# Patient Record
Sex: Female | Born: 1980 | Race: White | Hispanic: No | Marital: Married | Smoking: Current every day smoker
Health system: Southern US, Community
[De-identification: ages and names within clinical notes are randomized; demographics above are authoritative.]

## PROBLEM LIST (undated history)

## (undated) DIAGNOSIS — Z9289 Personal history of other medical treatment: Secondary | ICD-10-CM

## (undated) DIAGNOSIS — F319 Bipolar disorder, unspecified: Secondary | ICD-10-CM

## (undated) DIAGNOSIS — F419 Anxiety disorder, unspecified: Secondary | ICD-10-CM

## (undated) DIAGNOSIS — F1010 Alcohol abuse, uncomplicated: Secondary | ICD-10-CM

## (undated) DIAGNOSIS — M797 Fibromyalgia: Secondary | ICD-10-CM

## (undated) DIAGNOSIS — J42 Unspecified chronic bronchitis: Secondary | ICD-10-CM

## (undated) DIAGNOSIS — J189 Pneumonia, unspecified organism: Secondary | ICD-10-CM

## (undated) DIAGNOSIS — R519 Headache, unspecified: Secondary | ICD-10-CM

## (undated) DIAGNOSIS — F1410 Cocaine abuse, uncomplicated: Secondary | ICD-10-CM

## (undated) DIAGNOSIS — F32A Depression, unspecified: Secondary | ICD-10-CM

## (undated) DIAGNOSIS — M84475G Pathological fracture, left foot, subsequent encounter for fracture with delayed healing: Secondary | ICD-10-CM

## (undated) DIAGNOSIS — F329 Major depressive disorder, single episode, unspecified: Secondary | ICD-10-CM

## (undated) DIAGNOSIS — R51 Headache: Secondary | ICD-10-CM

## (undated) DIAGNOSIS — D649 Anemia, unspecified: Secondary | ICD-10-CM

## (undated) HISTORY — PX: CHEST TUBE INSERTION: SHX231

## (undated) HISTORY — PX: TUBAL LIGATION: SHX77

## (undated) HISTORY — PX: TONSILLECTOMY: SUR1361

---

## 1998-01-13 ENCOUNTER — Other Ambulatory Visit: Admission: RE | Admit: 1998-01-13 | Discharge: 1998-01-13 | Payer: Self-pay | Admitting: Obstetrics and Gynecology

## 2006-04-24 ENCOUNTER — Inpatient Hospital Stay (HOSPITAL_COMMUNITY): Admission: AD | Admit: 2006-04-24 | Discharge: 2006-04-24 | Payer: Self-pay | Admitting: Gynecology

## 2006-05-06 ENCOUNTER — Inpatient Hospital Stay (HOSPITAL_COMMUNITY): Admission: AD | Admit: 2006-05-06 | Discharge: 2006-05-06 | Payer: Self-pay | Admitting: Family Medicine

## 2006-05-29 ENCOUNTER — Inpatient Hospital Stay (HOSPITAL_COMMUNITY): Admission: AD | Admit: 2006-05-29 | Discharge: 2006-05-29 | Payer: Self-pay | Admitting: Gynecology

## 2006-06-19 ENCOUNTER — Inpatient Hospital Stay (HOSPITAL_COMMUNITY): Admission: AD | Admit: 2006-06-19 | Discharge: 2006-06-20 | Payer: Self-pay | Admitting: *Deleted

## 2006-06-20 ENCOUNTER — Encounter: Admission: RE | Admit: 2006-06-20 | Discharge: 2006-06-20 | Payer: Self-pay | Admitting: Orthopedic Surgery

## 2009-08-10 ENCOUNTER — Emergency Department (HOSPITAL_COMMUNITY): Admission: EM | Admit: 2009-08-10 | Discharge: 2009-08-10 | Payer: Self-pay | Admitting: Emergency Medicine

## 2009-08-28 ENCOUNTER — Emergency Department (HOSPITAL_COMMUNITY): Admission: EM | Admit: 2009-08-28 | Discharge: 2009-08-28 | Payer: Self-pay | Admitting: Emergency Medicine

## 2009-10-25 ENCOUNTER — Emergency Department (HOSPITAL_COMMUNITY): Admission: EM | Admit: 2009-10-25 | Discharge: 2009-10-25 | Payer: Self-pay | Admitting: Emergency Medicine

## 2009-11-04 ENCOUNTER — Emergency Department (HOSPITAL_COMMUNITY): Admission: EM | Admit: 2009-11-04 | Discharge: 2009-11-04 | Payer: Self-pay | Admitting: Emergency Medicine

## 2010-01-05 ENCOUNTER — Emergency Department (HOSPITAL_BASED_OUTPATIENT_CLINIC_OR_DEPARTMENT_OTHER): Admission: EM | Admit: 2010-01-05 | Discharge: 2010-01-05 | Payer: Self-pay | Admitting: Emergency Medicine

## 2010-04-23 ENCOUNTER — Emergency Department (HOSPITAL_COMMUNITY): Admission: EM | Admit: 2010-04-23 | Discharge: 2010-04-23 | Payer: Self-pay | Admitting: Emergency Medicine

## 2010-04-25 ENCOUNTER — Ambulatory Visit: Payer: Self-pay | Admitting: Radiology

## 2010-04-25 ENCOUNTER — Emergency Department (HOSPITAL_BASED_OUTPATIENT_CLINIC_OR_DEPARTMENT_OTHER): Admission: EM | Admit: 2010-04-25 | Discharge: 2010-04-25 | Payer: Self-pay | Admitting: Emergency Medicine

## 2010-04-26 ENCOUNTER — Emergency Department (HOSPITAL_COMMUNITY): Admission: EM | Admit: 2010-04-26 | Discharge: 2010-04-28 | Payer: Self-pay | Admitting: Emergency Medicine

## 2010-04-28 ENCOUNTER — Inpatient Hospital Stay (HOSPITAL_COMMUNITY): Admission: AD | Admit: 2010-04-28 | Discharge: 2010-05-04 | Payer: Self-pay | Admitting: Psychiatry

## 2010-04-28 ENCOUNTER — Ambulatory Visit: Payer: Self-pay | Admitting: Psychiatry

## 2011-01-23 ENCOUNTER — Encounter (HOSPITAL_COMMUNITY): Payer: Self-pay | Admitting: Radiology

## 2011-01-23 ENCOUNTER — Observation Stay (HOSPITAL_COMMUNITY)
Admission: AD | Admit: 2011-01-23 | Discharge: 2011-01-25 | Disposition: A | Discharge status: Other Acute Inpatient Hospital | Payer: Medicaid Other | Source: Ambulatory Visit | Attending: Obstetrics & Gynecology | Admitting: Obstetrics & Gynecology

## 2011-01-23 ENCOUNTER — Inpatient Hospital Stay (HOSPITAL_COMMUNITY): Payer: Medicaid Other

## 2011-01-23 DIAGNOSIS — O99340 Other mental disorders complicating pregnancy, unspecified trimester: Principal | ICD-10-CM | POA: Insufficient documentation

## 2011-01-23 DIAGNOSIS — F39 Unspecified mood [affective] disorder: Secondary | ICD-10-CM | POA: Insufficient documentation

## 2011-01-23 LAB — CBC
HCT: 34.9 % — ABNORMAL LOW (ref 36.0–46.0)
MCH: 28.7 pg (ref 26.0–34.0)
MCV: 85.5 fL (ref 78.0–100.0)
RBC: 4.08 MIL/uL (ref 3.87–5.11)
RDW: 11.9 % (ref 11.5–15.5)
WBC: 8.3 10*3/uL (ref 4.0–10.5)

## 2011-01-23 LAB — DIFFERENTIAL
Band Neutrophils: 0 % (ref 0–10)
Basophils Absolute: 0 10*3/uL (ref 0.0–0.1)
Basophils Relative: 0 % (ref 0–1)
Blasts: 0 %
Eosinophils Absolute: 0.3 10*3/uL (ref 0.0–0.7)
Eosinophils Relative: 3 % (ref 0–5)
Lymphs Abs: 3.1 10*3/uL (ref 0.7–4.0)
Metamyelocytes Relative: 0 %
Myelocytes: 0 %
Neutro Abs: 4.2 10*3/uL (ref 1.7–7.7)

## 2011-01-23 LAB — COMPREHENSIVE METABOLIC PANEL
ALT: 21 U/L (ref 0–35)
AST: 19 U/L (ref 0–37)
Albumin: 3.9 g/dL (ref 3.5–5.2)
Alkaline Phosphatase: 71 U/L (ref 39–117)
BUN: 11 mg/dL (ref 6–23)
CO2: 26 mEq/L (ref 19–32)
Chloride: 104 mEq/L (ref 96–112)
Potassium: 3.5 mEq/L (ref 3.5–5.1)

## 2011-01-23 LAB — WET PREP, GENITAL
Trich, Wet Prep: NONE SEEN
Yeast Wet Prep HPF POC: NONE SEEN

## 2011-01-23 LAB — POCT PREGNANCY, URINE: Preg Test, Ur: POSITIVE

## 2011-01-23 LAB — ETHANOL: Alcohol, Ethyl (B): <5 mg/dL (ref 0–10)

## 2011-01-23 LAB — RAPID URINE DRUG SCREEN, HOSP PERFORMED: Opiates: NOT DETECTED

## 2011-01-24 LAB — GC/CHLAMYDIA PROBE AMP, GENITAL: Chlamydia, DNA Probe: NEGATIVE

## 2011-01-25 ENCOUNTER — Inpatient Hospital Stay (HOSPITAL_COMMUNITY)
Admission: AD | Admit: 2011-01-25 | Discharge: 2011-01-30 | DRG: 781 | Disposition: A | Discharge status: Home / Self Care | Payer: PRIVATE HEALTH INSURANCE | Source: Ambulatory Visit | Attending: Psychiatry | Admitting: Psychiatry

## 2011-01-25 DIAGNOSIS — F1211 Cannabis abuse, in remission: Secondary | ICD-10-CM

## 2011-01-25 DIAGNOSIS — F322 Major depressive disorder, single episode, severe without psychotic features: Secondary | ICD-10-CM

## 2011-01-25 DIAGNOSIS — F1411 Cocaine abuse, in remission: Secondary | ICD-10-CM

## 2011-01-25 DIAGNOSIS — Z818 Family history of other mental and behavioral disorders: Secondary | ICD-10-CM

## 2011-01-25 DIAGNOSIS — F1020 Alcohol dependence, uncomplicated: Secondary | ICD-10-CM

## 2011-01-25 DIAGNOSIS — IMO0002 Reserved for concepts with insufficient information to code with codable children: Secondary | ICD-10-CM

## 2011-01-25 DIAGNOSIS — F4310 Post-traumatic stress disorder, unspecified: Secondary | ICD-10-CM

## 2011-01-25 DIAGNOSIS — O99340 Other mental disorders complicating pregnancy, unspecified trimester: Principal | ICD-10-CM

## 2011-01-25 DIAGNOSIS — F339 Major depressive disorder, recurrent, unspecified: Secondary | ICD-10-CM

## 2011-01-25 DIAGNOSIS — D649 Anemia, unspecified: Secondary | ICD-10-CM

## 2011-01-25 DIAGNOSIS — Z8614 Personal history of Methicillin resistant Staphylococcus aureus infection: Secondary | ICD-10-CM

## 2011-01-26 DIAGNOSIS — F339 Major depressive disorder, recurrent, unspecified: Secondary | ICD-10-CM

## 2011-01-26 DIAGNOSIS — F1010 Alcohol abuse, uncomplicated: Secondary | ICD-10-CM
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
## 2011-01-28 LAB — DIFFERENTIAL
Eosinophils Absolute: 0 10*3/uL (ref 0.0–0.7)
Lymphs Abs: 1 10*3/uL (ref 0.7–4.0)
Monocytes Absolute: 0.7 10*3/uL (ref 0.1–1.0)
Monocytes Relative: 7 % (ref 3–12)
Neutro Abs: 8.4 10*3/uL — ABNORMAL HIGH (ref 1.7–7.7)

## 2011-01-28 LAB — CBC
HCT: 38.4 % (ref 36.0–46.0)
MCHC: 34.7 g/dL (ref 30.0–36.0)
MCV: 86.4 fL (ref 78.0–100.0)
RBC: 4.44 MIL/uL (ref 3.87–5.11)
WBC: 10 10*3/uL (ref 4.0–10.5)

## 2011-01-28 LAB — TROPONIN I: Troponin I: 0.01 ng/mL (ref 0.00–0.06)

## 2011-01-28 LAB — BASIC METABOLIC PANEL
BUN: 4 mg/dL — ABNORMAL LOW (ref 6–23)
BUN: 5 mg/dL — ABNORMAL LOW (ref 6–23)
BUN: 5 mg/dL — ABNORMAL LOW (ref 6–23)
Calcium: 9.3 mg/dL (ref 8.4–10.5)
Chloride: 92 mEq/L — ABNORMAL LOW (ref 96–112)
Chloride: 93 mEq/L — ABNORMAL LOW (ref 96–112)
Chloride: 95 mEq/L — ABNORMAL LOW (ref 96–112)
Creatinine, Ser: 0.7 mg/dL (ref 0.4–1.2)
Creatinine, Ser: 0.8 mg/dL (ref 0.4–1.2)
GFR calc Af Amer: >60 mL/min (ref >60)
GFR calc Af Amer: >60 mL/min (ref >60)
GFR calc non Af Amer: >60 mL/min (ref >60)
GFR calc non Af Amer: >60 mL/min (ref >60)
GFR calc non Af Amer: >60 mL/min (ref >60)
Glucose, Bld: 92 mg/dL (ref 70–99)
Potassium: 3.5 mEq/L (ref 3.5–5.1)
Sodium: 129 mEq/L — ABNORMAL LOW (ref 135–145)

## 2011-01-28 LAB — CK TOTAL AND CKMB (NOT AT ARMC)
Relative Index: 2.3 (ref 0.0–2.5)
Total CK: 168 U/L (ref 7–177)

## 2011-01-28 LAB — URINALYSIS, ROUTINE W REFLEX MICROSCOPIC
Hgb urine dipstick: NEGATIVE
Protein, ur: NEGATIVE mg/dL
Specific Gravity, Urine: 1.011 (ref 1.005–1.030)

## 2011-01-28 LAB — PREGNANCY, URINE: Preg Test, Ur: NEGATIVE

## 2011-01-28 LAB — RAPID URINE DRUG SCREEN, HOSP PERFORMED
Barbiturates: NOT DETECTED
Benzodiazepines: POSITIVE — AB
Cocaine: NOT DETECTED
Tetrahydrocannabinol: NOT DETECTED

## 2011-01-28 LAB — SALICYLATE LEVEL: Salicylate Lvl: <4 mg/dL (ref 2.8–20.0)

## 2011-01-28 LAB — ACETAMINOPHEN LEVEL: Acetaminophen (Tylenol), Serum: <10 ug/mL — ABNORMAL LOW (ref 10–30)

## 2011-01-28 LAB — COMPREHENSIVE METABOLIC PANEL
Calcium: 9.7 mg/dL (ref 8.4–10.5)
Chloride: 93 mEq/L — ABNORMAL LOW (ref 96–112)
Glucose, Bld: 105 mg/dL — ABNORMAL HIGH (ref 70–99)
Potassium: 3.9 mEq/L (ref 3.5–5.1)
Sodium: 129 mEq/L — ABNORMAL LOW (ref 135–145)

## 2011-01-28 LAB — RAPID STREP SCREEN (MED CTR MEBANE ONLY): Streptococcus, Group A Screen (Direct): NEGATIVE

## 2011-01-29 LAB — DIFFERENTIAL
Basophils Relative: 1 % (ref 0–1)
Lymphocytes Relative: 19 % (ref 12–46)
Lymphs Abs: 1 10*3/uL (ref 0.7–4.0)
Monocytes Absolute: 0.5 10*3/uL (ref 0.1–1.0)
Monocytes Relative: 9 % (ref 3–12)
Neutro Abs: 3.7 10*3/uL (ref 1.7–7.7)
Neutrophils Relative %: 69 % (ref 43–77)

## 2011-01-29 LAB — BASIC METABOLIC PANEL
Calcium: 9.4 mg/dL (ref 8.4–10.5)
Creatinine, Ser: 1 mg/dL (ref 0.4–1.2)
GFR calc Af Amer: >60 mL/min (ref >60)
GFR calc non Af Amer: >60 mL/min (ref >60)
Sodium: 135 mEq/L (ref 135–145)

## 2011-01-29 LAB — CBC
Hemoglobin: 13.7 g/dL (ref 12.0–15.0)
MCHC: 34 g/dL (ref 30.0–36.0)
RBC: 4.61 MIL/uL (ref 3.87–5.11)
WBC: 5.4 10*3/uL (ref 4.0–10.5)
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
## 2013-01-21 ENCOUNTER — Encounter (HOSPITAL_COMMUNITY): Payer: Self-pay | Admitting: *Deleted

## 2013-01-21 ENCOUNTER — Ambulatory Visit (HOSPITAL_COMMUNITY)
Admission: RE | Admit: 2013-01-21 | Discharge: 2013-01-21 | Disposition: A | Discharge status: Home / Self Care | Payer: Self-pay | Attending: Psychiatry | Admitting: Psychiatry

## 2013-01-21 DIAGNOSIS — F1310 Sedative, hypnotic or anxiolytic abuse, uncomplicated: Secondary | ICD-10-CM | POA: Insufficient documentation

## 2013-01-21 DIAGNOSIS — F411 Generalized anxiety disorder: Secondary | ICD-10-CM | POA: Insufficient documentation

## 2013-01-21 DIAGNOSIS — F1020 Alcohol dependence, uncomplicated: Secondary | ICD-10-CM | POA: Insufficient documentation

## 2013-01-21 DIAGNOSIS — F1410 Cocaine abuse, uncomplicated: Secondary | ICD-10-CM | POA: Insufficient documentation

## 2013-01-21 DIAGNOSIS — F329 Major depressive disorder, single episode, unspecified: Secondary | ICD-10-CM | POA: Insufficient documentation

## 2013-01-21 DIAGNOSIS — F3289 Other specified depressive episodes: Secondary | ICD-10-CM | POA: Insufficient documentation

## 2013-01-21 HISTORY — DX: Pathological fracture, left foot, subsequent encounter for fracture with delayed healing: M84.475G

## 2013-01-21 HISTORY — DX: Major depressive disorder, single episode, unspecified: F32.9

## 2013-01-21 HISTORY — DX: Depression, unspecified: F32.A

## 2013-01-21 HISTORY — DX: Fibromyalgia: M79.7
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
## 2013-01-23 ENCOUNTER — Other Ambulatory Visit (HOSPITAL_COMMUNITY): Payer: Self-pay | Attending: Psychiatry | Admitting: Psychology

## 2013-01-23 DIAGNOSIS — F1410 Cocaine abuse, uncomplicated: Secondary | ICD-10-CM | POA: Insufficient documentation

## 2013-01-23 DIAGNOSIS — F1010 Alcohol abuse, uncomplicated: Secondary | ICD-10-CM | POA: Insufficient documentation

## 2013-01-23 DIAGNOSIS — F1020 Alcohol dependence, uncomplicated: Secondary | ICD-10-CM

## 2013-01-23 DIAGNOSIS — F339 Major depressive disorder, recurrent, unspecified: Secondary | ICD-10-CM | POA: Insufficient documentation

## 2013-01-26 ENCOUNTER — Other Ambulatory Visit (HOSPITAL_COMMUNITY): Payer: Self-pay

## 2013-01-26 ENCOUNTER — Encounter (HOSPITAL_COMMUNITY): Payer: Self-pay | Admitting: Psychology

## 2013-01-26 DIAGNOSIS — F1020 Alcohol dependence, uncomplicated: Secondary | ICD-10-CM | POA: Insufficient documentation

## 2013-01-26 NOTE — Progress Notes (Unsigned)
    Daily Group Progress Note  Program: CD-IOP   Group Time: 1-2:30 PM  Participation Level: Minimal  Behavioral Response: Sharing  Type of Therapy: Process Group  Topic: Group Process: first part of group was spent in process. Members shared about their current struggles in early recovery. There was a new member in group and she introduced herself briefly. There was good disclosure among members. As the session continued, the medical director met with all new group members.   Group Time: 2:45 -4 pm  Participation Level: Minimal  Behavioral Response: Sharing and Resistant  Type of Therapy: Psycho-education Group  Topic: "The Recovery Pie": The second part of group was spent in a presentation on The Recovery Pie, which includes 8 basic elements in recovery. As I drew a pie with 8 segments or pieces on the board, group members identified the different parts. The importance of each one was emphasized and new members were instructed to begin to incorporate these elements into their daily lives. The importance of building a strong routine was reiterated and there was good insight shared by members who have entered these elements into their lives and the benefits of this consistent structure. The elements of this Recovery Pie include: 12-step meetings, working with a sponsor and talking with new friends in recovery, spirituality, exercise, diet, sleep, recreation, and [place].    Summary: The patient participated in group discussion about ways to stay sober. Pt also told the group about her struggle with depression and suicide attempt. She struggles with stress and appears to have few healthy coping skills. She admitted she has 4 small children and is very concerned about them and whether she has caused damage because of her behavior at times. She admitted she does not have many of the 8 pieces of the pie that she does with any regularity. She did well for this first group session and met with the  medical director in the second part of group.    Family Program: Family present? No   Name of family member(s):   UDS collected: No Results:   AA/NA attended?: No, the patient is new to the program  Sponsor?: No   Quency Tober, LCAS

## 2013-01-28 ENCOUNTER — Other Ambulatory Visit (HOSPITAL_COMMUNITY): Payer: Self-pay | Admitting: Psychology

## 2013-01-29 ENCOUNTER — Encounter (HOSPITAL_COMMUNITY): Payer: Self-pay | Admitting: Psychology

## 2013-01-29 ENCOUNTER — Other Ambulatory Visit (HOSPITAL_COMMUNITY): Payer: Self-pay | Admitting: Psychiatry

## 2013-01-29 DIAGNOSIS — F329 Major depressive disorder, single episode, unspecified: Secondary | ICD-10-CM | POA: Insufficient documentation

## 2013-01-29 LAB — PRESCRIPTION ABUSE MONITORING 17P, URINE
Amphetamine/Meth: NEGATIVE ng/mL
Barbiturate Screen, Urine: NEGATIVE ng/mL
Benzodiazepine Screen, Urine: NEGATIVE ng/mL
Carisoprodol, Urine: NEGATIVE ng/mL
Cocaine Metabolites: NEGATIVE ng/mL
Fentanyl, Ur: NEGATIVE ng/mL
MDMA URINE: NEGATIVE ng/mL
Meperidine, Ur: NEGATIVE ng/mL
Oxycodone Screen, Ur: NEGATIVE ng/mL
Propoxyphene: NEGATIVE ng/mL
Tramadol Scrn, Ur: NEGATIVE ng/mL
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
## 2013-01-30 ENCOUNTER — Telehealth (HOSPITAL_COMMUNITY): Payer: Self-pay | Admitting: Psychology

## 2013-01-30 ENCOUNTER — Encounter (HOSPITAL_COMMUNITY): Payer: Self-pay | Admitting: *Deleted

## 2013-01-30 ENCOUNTER — Telehealth (HOSPITAL_COMMUNITY): Payer: Self-pay | Admitting: Behavioral Health

## 2013-01-30 ENCOUNTER — Inpatient Hospital Stay (HOSPITAL_COMMUNITY)
Admission: AD | Admit: 2013-01-30 | Discharge: 2013-02-03 | DRG: 897 | Disposition: A | Discharge status: Home / Self Care | Payer: Federal, State, Local not specified - Other | Source: Other Acute Inpatient Hospital | Attending: Psychiatry | Admitting: Psychiatry

## 2013-01-30 ENCOUNTER — Emergency Department (HOSPITAL_COMMUNITY)
Admission: EM | Admit: 2013-01-30 | Discharge: 2013-01-30 | Disposition: A | Discharge status: Home / Self Care | Payer: Self-pay | Attending: Emergency Medicine | Admitting: Emergency Medicine

## 2013-01-30 ENCOUNTER — Other Ambulatory Visit (HOSPITAL_COMMUNITY): Payer: Self-pay

## 2013-01-30 DIAGNOSIS — F3289 Other specified depressive episodes: Secondary | ICD-10-CM | POA: Insufficient documentation

## 2013-01-30 DIAGNOSIS — Z79899 Other long term (current) drug therapy: Secondary | ICD-10-CM | POA: Insufficient documentation

## 2013-01-30 DIAGNOSIS — F17200 Nicotine dependence, unspecified, uncomplicated: Secondary | ICD-10-CM | POA: Insufficient documentation

## 2013-01-30 DIAGNOSIS — F329 Major depressive disorder, single episode, unspecified: Secondary | ICD-10-CM

## 2013-01-30 DIAGNOSIS — G8929 Other chronic pain: Secondary | ICD-10-CM | POA: Insufficient documentation

## 2013-01-30 DIAGNOSIS — F339 Major depressive disorder, recurrent, unspecified: Secondary | ICD-10-CM | POA: Diagnosis present

## 2013-01-30 DIAGNOSIS — M542 Cervicalgia: Secondary | ICD-10-CM | POA: Insufficient documentation

## 2013-01-30 DIAGNOSIS — Z8739 Personal history of other diseases of the musculoskeletal system and connective tissue: Secondary | ICD-10-CM | POA: Insufficient documentation

## 2013-01-30 DIAGNOSIS — IMO0001 Reserved for inherently not codable concepts without codable children: Secondary | ICD-10-CM | POA: Diagnosis present

## 2013-01-30 DIAGNOSIS — Z8781 Personal history of (healed) traumatic fracture: Secondary | ICD-10-CM | POA: Insufficient documentation

## 2013-01-30 DIAGNOSIS — F1410 Cocaine abuse, uncomplicated: Secondary | ICD-10-CM | POA: Diagnosis present

## 2013-01-30 DIAGNOSIS — F10129 Alcohol abuse with intoxication, unspecified: Secondary | ICD-10-CM | POA: Diagnosis present

## 2013-01-30 DIAGNOSIS — F1010 Alcohol abuse, uncomplicated: Principal | ICD-10-CM | POA: Diagnosis present

## 2013-01-30 DIAGNOSIS — F1020 Alcohol dependence, uncomplicated: Secondary | ICD-10-CM

## 2013-01-30 LAB — URINALYSIS, ROUTINE W REFLEX MICROSCOPIC
Bilirubin Urine: NEGATIVE
Glucose, UA: NEGATIVE mg/dL
Hgb urine dipstick: NEGATIVE
Ketones, ur: NEGATIVE mg/dL
Leukocytes, UA: NEGATIVE
Nitrite: NEGATIVE
Protein, ur: NEGATIVE mg/dL
Specific Gravity, Urine: 1.012 (ref 1.005–1.030)
Urobilinogen, UA: 0.2 mg/dL (ref 0.0–1.0)
pH: 7.5 (ref 5.0–8.0)

## 2013-01-30 LAB — CBC WITH DIFFERENTIAL/PLATELET
Basophils Absolute: 0 K/uL (ref 0.0–0.1)
Basophils Relative: 0 % (ref 0–1)
Eosinophils Absolute: 0.2 K/uL (ref 0.0–0.7)
Eosinophils Relative: 3 % (ref 0–5)
HCT: 37.8 % (ref 36.0–46.0)
Hemoglobin: 12.7 g/dL (ref 12.0–15.0)
Lymphocytes Relative: 45 % (ref 12–46)
Lymphs Abs: 2.7 10*3/uL (ref 0.7–4.0)
MCH: 29.3 pg (ref 26.0–34.0)
MCHC: 33.6 g/dL (ref 30.0–36.0)
MCV: 87.1 fL (ref 78.0–100.0)
Monocytes Absolute: 0.6 K/uL (ref 0.1–1.0)
Monocytes Relative: 11 % (ref 3–12)
Neutro Abs: 2.4 10*3/uL (ref 1.7–7.7)
Neutrophils Relative %: 41 % — ABNORMAL LOW (ref 43–77)
Platelets: 322 K/uL (ref 150–400)
RBC: 4.34 MIL/uL (ref 3.87–5.11)
RDW: 12.1 % (ref 11.5–15.5)
WBC: 5.9 K/uL (ref 4.0–10.5)

## 2013-01-30 LAB — COMPREHENSIVE METABOLIC PANEL WITH GFR
ALT: 11 U/L (ref 0–35)
AST: 14 U/L (ref 0–37)
Calcium: 9.5 mg/dL (ref 8.4–10.5)
Creatinine, Ser: 0.67 mg/dL (ref 0.50–1.10)
Sodium: 138 meq/L (ref 135–145)
Total Protein: 7.3 g/dL (ref 6.0–8.3)

## 2013-01-30 LAB — RAPID URINE DRUG SCREEN, HOSP PERFORMED
Amphetamines: NOT DETECTED
Barbiturates: NOT DETECTED
Benzodiazepines: NOT DETECTED
Cocaine: NOT DETECTED
Opiates: NOT DETECTED
Tetrahydrocannabinol: NOT DETECTED

## 2013-01-30 LAB — COMPREHENSIVE METABOLIC PANEL
Albumin: 3.9 g/dL (ref 3.5–5.2)
Alkaline Phosphatase: 79 U/L (ref 39–117)
BUN: 8 mg/dL (ref 6–23)
CO2: 27 mEq/L (ref 19–32)
Chloride: 101 mEq/L (ref 96–112)
GFR calc Af Amer: >90 mL/min (ref >90)
GFR calc non Af Amer: >90 mL/min (ref >90)
Glucose, Bld: 97 mg/dL (ref 70–99)
Potassium: 3.4 mEq/L — ABNORMAL LOW (ref 3.5–5.1)
Total Bilirubin: 0.3 mg/dL (ref 0.3–1.2)

## 2013-01-30 LAB — ETHANOL: Alcohol, Ethyl (B): <11 mg/dL (ref 0–11)

## 2013-01-30 MED ORDER — CHLORDIAZEPOXIDE HCL 25 MG PO CAPS
25.0000 mg | ORAL_CAPSULE | Freq: Once | ORAL | Status: AC
  Administered 2013-01-31: 25 mg via ORAL
  Filled 2013-01-30: qty 1

## 2013-01-30 MED ORDER — METHOCARBAMOL 500 MG PO TABS
500.0000 mg | ORAL_TABLET | Freq: Three times a day (TID) | ORAL | Status: DC | PRN
  Administered 2013-01-31 – 2013-02-02 (×6): 500 mg via ORAL
  Filled 2013-01-30 (×6): qty 1

## 2013-01-30 MED ORDER — NAPROXEN 500 MG PO TABS
500.0000 mg | ORAL_TABLET | Freq: Two times a day (BID) | ORAL | Status: DC | PRN
  Administered 2013-01-31 – 2013-02-02 (×5): 500 mg via ORAL
  Filled 2013-01-30 (×5): qty 1

## 2013-01-30 MED ORDER — MAGNESIUM HYDROXIDE 400 MG/5ML PO SUSP
30.0000 mL | Freq: Every day | ORAL | Status: DC | PRN
  Administered 2013-02-01 – 2013-02-02 (×2): 30 mL via ORAL

## 2013-01-30 MED ORDER — CHLORDIAZEPOXIDE HCL 25 MG PO CAPS
25.0000 mg | ORAL_CAPSULE | Freq: Four times a day (QID) | ORAL | Status: AC
  Administered 2013-01-31 – 2013-02-01 (×6): 25 mg via ORAL
  Filled 2013-01-30 (×6): qty 1

## 2013-01-30 MED ORDER — LOPERAMIDE HCL 2 MG PO CAPS: 2.0000 mg | ORAL_CAPSULE | ORAL | Status: AC | PRN

## 2013-01-30 MED ORDER — VITAMIN B-1 100 MG PO TABS
100.0000 mg | ORAL_TABLET | Freq: Every day | ORAL | Status: DC
  Administered 2013-01-31 – 2013-02-03 (×4): 100 mg via ORAL
  Filled 2013-01-30 (×5): qty 1

## 2013-01-30 MED ORDER — CHLORDIAZEPOXIDE HCL 25 MG PO CAPS: 25.0000 mg | ORAL_CAPSULE | Freq: Every day | ORAL | Status: DC

## 2013-01-30 MED ORDER — CHLORDIAZEPOXIDE HCL 25 MG PO CAPS
25.0000 mg | ORAL_CAPSULE | ORAL | Status: AC
  Administered 2013-02-02 – 2013-02-03 (×2): 25 mg via ORAL
  Filled 2013-01-30 (×2): qty 1

## 2013-01-30 MED ORDER — DICYCLOMINE HCL 20 MG PO TABS
20.0000 mg | ORAL_TABLET | Freq: Four times a day (QID) | ORAL | Status: DC | PRN
  Administered 2013-01-31: 20 mg via ORAL
  Filled 2013-01-30: qty 1

## 2013-01-30 MED ORDER — HYDROXYZINE HCL 25 MG PO TABS
25.0000 mg | ORAL_TABLET | Freq: Four times a day (QID) | ORAL | Status: AC | PRN
  Administered 2013-01-31 – 2013-02-02 (×4): 25 mg via ORAL

## 2013-01-30 MED ORDER — ADULT MULTIVITAMIN W/MINERALS CH
1.0000 | ORAL_TABLET | Freq: Every day | ORAL | Status: DC
  Administered 2013-01-31 – 2013-02-03 (×4): 1 via ORAL
  Filled 2013-01-30 (×5): qty 1

## 2013-01-30 MED ORDER — ONDANSETRON 4 MG PO TBDP
4.0000 mg | ORAL_TABLET | Freq: Four times a day (QID) | ORAL | Status: DC | PRN
  Administered 2013-01-31: 4 mg via ORAL

## 2013-01-30 MED ORDER — CLONIDINE HCL 0.1 MG PO TABS: 0.1000 mg | ORAL_TABLET | Freq: Every day | ORAL | Status: DC

## 2013-01-30 MED ORDER — ACETAMINOPHEN 325 MG PO TABS: 650.0000 mg | ORAL_TABLET | Freq: Four times a day (QID) | ORAL | Status: DC | PRN

## 2013-01-30 MED ORDER — ALUM & MAG HYDROXIDE-SIMETH 200-200-20 MG/5ML PO SUSP
30.0000 mL | ORAL | Status: DC | PRN
  Administered 2013-02-01: 30 mL via ORAL

## 2013-01-30 MED ORDER — CHLORDIAZEPOXIDE HCL 25 MG PO CAPS
25.0000 mg | ORAL_CAPSULE | Freq: Three times a day (TID) | ORAL | Status: AC
  Administered 2013-02-01 – 2013-02-02 (×3): 25 mg via ORAL
  Filled 2013-01-30 (×2): qty 1

## 2013-01-30 MED ORDER — ONDANSETRON 4 MG PO TBDP: 4.0000 mg | ORAL_TABLET | Freq: Four times a day (QID) | ORAL | Status: AC | PRN

## 2013-01-30 MED ORDER — CLONIDINE HCL 0.1 MG PO TABS
0.1000 mg | ORAL_TABLET | Freq: Four times a day (QID) | ORAL | Status: AC
  Administered 2013-01-31 – 2013-02-02 (×3): 0.1 mg via ORAL
  Filled 2013-01-30 (×10): qty 1

## 2013-01-30 MED ORDER — THIAMINE HCL 100 MG/ML IJ SOLN
100.0000 mg | Freq: Once | INTRAMUSCULAR | Status: DC
Start: 2013-01-31 — End: 2013-02-03

## 2013-01-30 MED ORDER — TRAZODONE HCL 50 MG PO TABS
50.0000 mg | ORAL_TABLET | Freq: Every day | ORAL | Status: DC
  Administered 2013-01-31: 50 mg via ORAL
  Filled 2013-01-30 (×4): qty 1

## 2013-01-30 MED ORDER — LOPERAMIDE HCL 2 MG PO CAPS: 2.0000 mg | ORAL_CAPSULE | ORAL | Status: DC | PRN

## 2013-01-30 MED ORDER — CLONIDINE HCL 0.1 MG PO TABS
0.1000 mg | ORAL_TABLET | ORAL | Status: DC
  Filled 2013-01-30 (×4): qty 1

## 2013-01-30 MED ORDER — CHLORDIAZEPOXIDE HCL 25 MG PO CAPS
25.0000 mg | ORAL_CAPSULE | Freq: Four times a day (QID) | ORAL | Status: AC | PRN
  Administered 2013-02-01 (×2): 25 mg via ORAL
  Filled 2013-01-30 (×3): qty 1

## 2013-01-30 NOTE — ED Provider Notes (Signed)
Medical screening examination/treatment/procedure(s) were performed by non-physician practitioner and as supervising physician I was immediately available for consultation/collaboration.  Calysta Craigo Y. Earvin Blazier, MD 01/30/13 2345 
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
## 2013-01-30 NOTE — ED Notes (Signed)
Pt brought in by Marketing executive from from [place].  IVC-per IV report, per pt's mother, pt has been abusing etoh and have drug addiction.  Pt with hx of this.

## 2013-01-30 NOTE — ED Notes (Signed)
Results sent to [facility]
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
## 2013-01-30 NOTE — ED Provider Notes (Signed)
ECG at time 19:25 shows NSR at rate 84, normal axis, normal intervals.  No ST or T wave abn's.  Wandering baseline.  Borderline ECG.  Likely no sig change compared to ECG from 04/27/10.  Sarah Ellis. Oletta Lamas, MD 01/30/13 2125

## 2013-01-31 ENCOUNTER — Telehealth (HOSPITAL_COMMUNITY): Payer: Self-pay | Admitting: Behavioral Health

## 2013-01-31 DIAGNOSIS — F10129 Alcohol abuse with intoxication, unspecified: Secondary | ICD-10-CM | POA: Diagnosis present

## 2013-01-31 DIAGNOSIS — F339 Major depressive disorder, recurrent, unspecified: Secondary | ICD-10-CM | POA: Diagnosis present

## 2013-01-31 DIAGNOSIS — F1410 Cocaine abuse, uncomplicated: Secondary | ICD-10-CM | POA: Diagnosis present

## 2013-01-31 MED ORDER — DULOXETINE HCL 60 MG PO CPEP
60.0000 mg | ORAL_CAPSULE | Freq: Every day | ORAL | Status: DC
  Administered 2013-01-31 – 2013-02-03 (×4): 60 mg via ORAL
  Filled 2013-01-31 (×5): qty 1

## 2013-01-31 MED ORDER — TRAZODONE HCL 100 MG PO TABS
100.0000 mg | ORAL_TABLET | Freq: Every day | ORAL | Status: DC
  Administered 2013-01-31 – 2013-02-02 (×3): 100 mg via ORAL
  Filled 2013-01-31 (×6): qty 1

## 2013-01-31 MED ORDER — TRAMADOL HCL 50 MG PO TABS
50.0000 mg | ORAL_TABLET | Freq: Once | ORAL | Status: AC
  Administered 2013-01-31: 50 mg via ORAL
  Filled 2013-01-31 (×2): qty 1

## 2013-01-31 NOTE — Tx Team (Signed)
Initial Interdisciplinary Treatment Plan  PATIENT STRENGTHS: (choose at least two) Ability for insight Active sense of humor Average or above average intelligence Capable of independent living Motivation for treatment/growth Supportive family/friends Work skills  PATIENT STRESSORS: Health problems Marital or family conflict Substance abuse   PROBLEM LIST: Problem List/Patient Goals Date to be addressed Date deferred Reason deferred Estimated date of resolution  Substance abuse 01/30/13     Suicidal Ideation (denies now) 01/30/13                                                DISCHARGE CRITERIA:  Improved stabilization in mood, thinking, and/or behavior Medical problems require only outpatient monitoring Motivation to continue treatment in a less acute level of care Need for constant or close observation no longer present Verbal commitment to aftercare and medication compliance Withdrawal symptoms are absent or subacute and managed without 24-hour nursing intervention  PRELIMINARY DISCHARGE PLAN: Attend 12-step recovery group Return to previous living arrangement  PATIENT/FAMIILY INVOLVEMENT: This treatment plan has been presented to and reviewed with the patient, Sarah Ellis.  The patient and family have been given the opportunity to ask questions and make suggestions.  Juliann Pares 01/31/2013, 12:29 AM

## 2013-01-31 NOTE — Clinical Social Work Note (Signed)
[facility] Group Notes: (Clinical Social Work)   01/31/2013      Type of Therapy:  Group Therapy   Participation Level:  Did Not Attend    Ambrose Mantle, LCSW 01/31/2013, 11:18 AM

## 2013-01-31 NOTE — Progress Notes (Signed)
Patient did attend the evening speaker AA meeting.  

## 2013-01-31 NOTE — Progress Notes (Addendum)
Patient ID: Sarah Ellis, female   DOB: 1981-04-30, 32 y.o.   MRN: [id number] D: Pt is asleep in bed this AM. Pt denies SI/HI and A/V hallucinations. Pt did not complete their self-inventory worksheet on request by staff. Pt's most recent CIWA score was 4. Pt mood is depressed and her affect is flat/sad. Pt is sleeping all morning. Pt forwards little to staff and returned to bed after receiving medications.  A: Encouraged pt to discuss feelings with staff and administered medication per MD orders. Writer also encouraged pt to attend groups.  R: Pt is not attending groups but is tolerating medications well. Writer will continue to monitor. 15 minute checks are ongoing for safety.
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
## 2013-01-31 NOTE — Progress Notes (Signed)
Adult Psychoeducational Group Note  Date:  01/31/2013 Time:  0900  Group Topic/Focus:  Patient Self Inventory Review Group  Participation Level:  Did Not Attend  Participation Quality:  Did not attend  Affect:  Did not attend  Cognitive:  Did not attend  Insight: did not attend  Engagement in Group:  Did not attend  Modes of Intervention:  Did not attend  Additional Comments:  Patient refused to come to group this morning.  Earline Mayotte 01/31/2013, 10:55 AM

## 2013-02-01 MED ORDER — NICOTINE 14 MG/24HR TD PT24
14.0000 mg | MEDICATED_PATCH | Freq: Every day | TRANSDERMAL | Status: DC
  Administered 2013-02-01: 14 mg via TRANSDERMAL
  Filled 2013-02-01 (×4): qty 1

## 2013-02-01 MED ORDER — NICOTINE POLACRILEX 2 MG MT GUM
2.0000 mg | CHEWING_GUM | OROMUCOSAL | Status: DC | PRN
  Administered 2013-02-02 (×2): 2 mg via ORAL

## 2013-02-01 MED ORDER — TRAMADOL HCL 50 MG PO TABS
100.0000 mg | ORAL_TABLET | Freq: Two times a day (BID) | ORAL | Status: DC
  Administered 2013-02-01 – 2013-02-03 (×4): 100 mg via ORAL
  Filled 2013-02-01 (×10): qty 2
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
## 2013-02-01 NOTE — Progress Notes (Signed)
Adult Psychoeducational Group Note  Date: 01/31/2013  Time: 11:00AM  Group Topic/Focus:  Healthy Communication: The focus of this group is to discuss communication, barriers to communication, as well as healthy ways to communicate with others.  Participation Level: Active  Participation Quality: Appropriate, Sharing and Supportive  Affect: Appropriate  Cognitive: Appropriate  Insight: Appropriate  Engagement in Group: Engaged and Supportive  Modes of Intervention: Education, Problem-solving and Support  Additional Comments: NONE  [name]  02/01/2013, 1:05 PM    

## 2013-02-01 NOTE — Clinical Social Work Note (Signed)
[facility] Group Notes:  (Clinical Social Work)  02/01/2013  10:00-11:00AM  Summary of Progress/Problems:   The main focus of today's process group was to   identify the patient's current support system and decide on other supports that can be put in place to prevent future hospitalizations.  A handout was used to explain the 4 definitions/levels of support and to think about what support patient has given and received from others.  An emphasis was placed on using counselor, doctor, therapy groups, 12-step groups, and problem-specific support groups to expand supports. The patient expressed understanding of the kinds of support, and an ability to examine her own supports (both given and received).  She was drowsy, and she also was in a group of people who were less open to the group topic than she was.  Type of Therapy:  Process Group with Motivational Interviewing  Participation Level:  Active  Participation Quality:  Attentive and Drowsy  Affect:  Anxious and Blunted  Cognitive:  Appropriate  Insight:  Developing/Improving  Engagement in Therapy:  Developing/Improving  Modes of Intervention:  Clarification, Education, Limit-setting, Problem-solving, Socialization, Support and Processing, Exploration, Discussion, Role-Play   Ambrose Mantle, LCSW 02/01/2013, 10:55 AM

## 2013-02-01 NOTE — Progress Notes (Signed)
D patient slept poorly d/t foot hurting, apetite is improving, energy level is low and abililty to pay attention is good, depressed 8/10 and hopeless 3/10, WD s/s include tremors, chills, cravings and agitation, headaches, dizziness and pain esp in foot, denies SI or HI. Taking meds as ordered by MD and going to DR for meals, attending groups and interacting w/peers. A q30min safety checks continues and support offered, encouraged to continue attending group R patient remains safe on the unit

## 2013-02-01 NOTE — Progress Notes (Signed)
Adult Psychoeducational Group Note  Date:  02/01/2013 Time:  0930  Group Topic/Focus: Self inventory sheets.  Participation Level:  Active  Participation Quality:  Appropriate, Attentive, Sharing and Supportive  Affect:  Anxious and Appropriate  Cognitive:  Alert and Oriented  Insight: Improving  Engagement in Group:  Developing/Improving, Engaged and Improving  Modes of Intervention:  Clarification, Discussion, Orientation and Socialization  Additional Comments:  none  Roselee Culver 02/01/2013, 5:41 PM

## 2013-02-01 NOTE — Progress Notes (Signed)
Patient did attend the evening speaker AA meeting.  

## 2013-02-01 NOTE — Progress Notes (Signed)
Date: 02/01/2013  Time: 15:15pm  Group Topic/Focus:  Making Healthy Choices: The focus of this group is to help patients identify negative/unhealthy choices they were using prior to admission and identify positive/healthier coping strategies to replace them upon discharge.  Participation Level: Active  Participation Quality: Appropriate, Sharing and Supportive  Affect: Appropriate  Cognitive: Appropriate  Insight: Appropriate  Engagement in Group: Engaged and Supportive  Modes of Intervention: Discussion, Education, Problem-solving and Support  Additional Comments: none  Shawnita Krizek M  02/01/2013, 4:07 PM  

## 2013-02-01 NOTE — Progress Notes (Signed)
Adult Psychoeducational Group Note  Date:  02/01/2013 Time:  1315  Group Topic/Focus:  Making Healthy Choices:   The focus of this group is to help patients identify negative/unhealthy choices they were using prior to admission and identify positive/healthier coping strategies to replace them upon discharge.  Participation Level:  Active  Participation Quality:  Appropriate, Attentive, Sharing and Supportive  Affect:  Appropriate and Flat  Cognitive:  Alert, Appropriate and Oriented  Insight: Improving  Engagement in Group:  Developing/Improving, Engaged and Supportive  Modes of Intervention:  Clarification, Discussion, Exploration, Problem-solving, Socialization and Support  Additional Comments:  Discussed self and goals for discharge and support of her family.  Roselee Culver 02/01/2013, 5:44 PM
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
## 2013-02-02 ENCOUNTER — Other Ambulatory Visit (HOSPITAL_COMMUNITY): Payer: Self-pay

## 2013-02-02 DIAGNOSIS — F1994 Other psychoactive substance use, unspecified with psychoactive substance-induced mood disorder: Secondary | ICD-10-CM

## 2013-02-02 DIAGNOSIS — F1010 Alcohol abuse, uncomplicated: Principal | ICD-10-CM

## 2013-02-02 DIAGNOSIS — F411 Generalized anxiety disorder: Secondary | ICD-10-CM

## 2013-02-02 DIAGNOSIS — F329 Major depressive disorder, single episode, unspecified: Secondary | ICD-10-CM

## 2013-02-02 LAB — BUPRENORPHINES (GC/LC/MS), URINE: Norbuprenorphine: 51 ng/mL

## 2013-02-02 MED ORDER — MAGNESIUM CITRATE PO SOLN
1.0000 | Freq: Once | ORAL | Status: AC
  Administered 2013-02-02: 1 via ORAL

## 2013-02-02 NOTE — Progress Notes (Signed)
[facility] LCSW Group Therapy  02/02/2013 1:15 PM  Type of Therapy:  Group Therapy 1:15 to 2:30 PM  Participation Level:  Did Not Attend    Clide Dales
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
## 2013-02-02 NOTE — Progress Notes (Signed)
Pt attended approximately half of the AA group. Pt appeared drowsy.

## 2013-02-02 NOTE — Progress Notes (Signed)
[facility] LCSW Aftercare Discharge Planning Group Note  02/02/2013 8:45 AM  Participation Quality:  Did Not Attend    Clide Dales 02/02/2013, 6:31 PM
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
## 2013-02-03 DIAGNOSIS — F10229 Alcohol dependence with intoxication, unspecified: Secondary | ICD-10-CM

## 2013-02-03 DIAGNOSIS — F339 Major depressive disorder, recurrent, unspecified: Secondary | ICD-10-CM

## 2013-02-03 LAB — RAPID URINE DRUG SCREEN, HOSP PERFORMED: Amphetamines: NOT DETECTED

## 2013-02-03 MED ORDER — DULOXETINE HCL 60 MG PO CPEP: 60.0000 mg | ORAL_CAPSULE | Freq: Every day | ORAL | Status: DC

## 2013-02-03 MED ORDER — TRAZODONE HCL 100 MG PO TABS: 100.0000 mg | ORAL_TABLET | Freq: Every day | ORAL | Status: DC

## 2013-02-03 NOTE — Progress Notes (Signed)
Patient ID: Sarah Ellis, female   DOB: [birth year], 32 y.o.   MRN: [id number]  D:  Pt approached the nurse inquiring about her meds. Stated she was upset because she was given an order for her roxicodone. Pt requested to speak to the PA in reference to her meds. Writer informed pt that Clinical research associate would attempt to verify her prescription. In the meantime the [facility] observed pt nodding in group. Informed the writer that a UDS was needed. Writer and [facility] called [facility] in [place], as requested to verify meds. However, per pharmacist pt hadn't gotten her meds filled at their location, and referred [place] to [facility] [place]. After calling the [facility] determined that pt's xanax and oxycodone were last filled 10/16/12.  Pt stated she didn't have money to get refills. Writer informed the pt that she'd probably need to speak with her pcp about another order.  A:  Support and encouragement was offered. 15 min checks continued for safety.  R: Pt remains safe.
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
## 2013-02-03 NOTE — Progress Notes (Signed)
[facility] LCSW Aftercare Discharge Planning Group Note  02/03/2013 8:45 AM  Participation Quality:  Attentive and Sharing  Affect:  Appropriate  Cognitive:  Appropriate  Insight:  Developing/Improving  Engagement in Group:  Developing/Improving  Modes of Intervention:  Clarification, Exploration, Problem-solving, Rapport Building and Support  Summary of Progress/Problems:  Pt denies both suicidal and homicidal ideation.  On a scale of 1 to 10 with ten being the most ever experienced, the patient rates depression at a 9 (due to pain in foot) and anxiety at an 8 (due to fear of reinjuring foot). Patient continues to report need for pain medication for foot.  Patient not convinced she needs treatment, was at Tabitha's house for 9 months and relapsed after a visit home.  Patient reports desire to be with children in [place], will consider allowing CSW to call husband.    Sarah Ellis
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
## 2013-02-03 NOTE — Progress Notes (Signed)
Pt was discharged home today. She denied any S/I H/I or A/V hallucinations.  She was given f/u appointment, rx, sample medications, and hotline info booklet. She voiced understanding to all instructions provided.  She declined the need for smoking cessation materials.  

## 2013-02-03 NOTE — Progress Notes (Signed)
[facility] INPATIENT:  Family/Significant Other Suicide Prevention Education  Suicide Prevention Education:  Patient Refusal for Family/Significant Other Suicide Prevention Education: The patient Sarah Ellis has refused to provide written consent for family/significant other to be provided Family/Significant Other Suicide Prevention Education during admission and/or prior to discharge. Patient insisted it would make matters worse for CSW to call either her mother or husband.  Writer provided suicide prevention education directly to patient in her room at 10:30 AM today; conversation included risk factors, warning signs and resources to contact for help. Mobile crisis services explained and contact card placed in chart for pt to receive at discharge.  Clide Dales 02/03/2013, 7:12 PM

## 2013-02-03 NOTE — Progress Notes (Signed)
[facility] Adult Case Management Discharge Plan :  Will you be returning to the same living situation after discharge: No. Patient will be staying with a friend At discharge, do you have transportation home?:Yes,  friend to pick her up Do you have the ability to pay for your medications:Yes,  patient reports "not a problem"  Release of information consent forms completed and in the chart;  Patient's signature needed at discharge.  Patient to Follow up at: Follow-up Information   Follow up with [facility] On 02/06/2013. (Go to walkin Clinic on Friday 02/06/13 between 8 AM and 12 noon (the earlier the better) for intake appointment. )    Contact information:   [address] [place] [address]  Ph [phone] Fax [phone]      Patient denies SI/HI:   Yes,  denies both    Safety Planning and Suicide Prevention discussed:  Yes,  with patient as she refused consent to call family  Clide Dales 02/03/2013, 7:14 PM

## 2013-02-04 ENCOUNTER — Other Ambulatory Visit (HOSPITAL_COMMUNITY): Payer: Self-pay

## 2013-02-04 NOTE — Discharge Summary (Signed)
Seen and agreed. Pahola Dimmitt, MD 
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
## 2013-02-06 ENCOUNTER — Other Ambulatory Visit (HOSPITAL_COMMUNITY): Payer: Self-pay

## 2013-02-06 NOTE — Progress Notes (Signed)
Patient Discharge Instructions:  After Visit Summary (AVS):   Faxed to:  02/06/13 Discharge Summary Note:   Faxed to:  02/06/13 Psychiatric Admission Assessment Note:   Faxed to:  02/06/13 Suicide Risk Assessment - Discharge Assessment:   Faxed to:  02/06/13 Faxed/Sent to the Next Level Care provider:  02/06/13 Faxed to [facility] @ [phone]  Jerelene Redden, 02/06/2013, 4:03 PM

## 2013-02-09 ENCOUNTER — Other Ambulatory Visit (HOSPITAL_COMMUNITY): Payer: Self-pay

## 2013-02-11 ENCOUNTER — Other Ambulatory Visit (HOSPITAL_COMMUNITY): Payer: Self-pay

## 2013-02-13 ENCOUNTER — Other Ambulatory Visit (HOSPITAL_COMMUNITY): Payer: Self-pay

## 2013-02-16 ENCOUNTER — Other Ambulatory Visit (HOSPITAL_COMMUNITY): Payer: Self-pay

## 2013-02-18 ENCOUNTER — Other Ambulatory Visit (HOSPITAL_COMMUNITY): Payer: Self-pay

## 2013-02-20 ENCOUNTER — Other Ambulatory Visit (HOSPITAL_COMMUNITY): Payer: Self-pay

## 2013-02-23 ENCOUNTER — Other Ambulatory Visit (HOSPITAL_COMMUNITY): Payer: Self-pay

## 2013-02-25 ENCOUNTER — Other Ambulatory Visit (HOSPITAL_COMMUNITY): Payer: Self-pay

## 2013-02-27 ENCOUNTER — Other Ambulatory Visit (HOSPITAL_COMMUNITY): Payer: Self-pay

## 2013-03-02 ENCOUNTER — Other Ambulatory Visit (HOSPITAL_COMMUNITY): Payer: Self-pay

## 2013-03-04 ENCOUNTER — Other Ambulatory Visit (HOSPITAL_COMMUNITY): Payer: Self-pay

## 2013-03-06 ENCOUNTER — Other Ambulatory Visit (HOSPITAL_COMMUNITY): Payer: Self-pay

## 2013-03-09 ENCOUNTER — Other Ambulatory Visit (HOSPITAL_COMMUNITY): Payer: Self-pay

## 2013-03-11 ENCOUNTER — Other Ambulatory Visit (HOSPITAL_COMMUNITY): Payer: Self-pay

## 2013-03-13 ENCOUNTER — Other Ambulatory Visit (HOSPITAL_COMMUNITY): Payer: Self-pay

## 2013-03-16 ENCOUNTER — Other Ambulatory Visit (HOSPITAL_COMMUNITY): Payer: Self-pay

## 2013-03-18 ENCOUNTER — Other Ambulatory Visit (HOSPITAL_COMMUNITY): Payer: Self-pay

## 2013-03-20 ENCOUNTER — Other Ambulatory Visit (HOSPITAL_COMMUNITY): Payer: Self-pay

## 2013-03-23 ENCOUNTER — Other Ambulatory Visit (HOSPITAL_COMMUNITY): Payer: Self-pay

## 2013-03-25 ENCOUNTER — Other Ambulatory Visit (HOSPITAL_COMMUNITY): Payer: Self-pay

## 2013-03-27 ENCOUNTER — Other Ambulatory Visit (HOSPITAL_COMMUNITY): Payer: Self-pay

## 2013-03-30 ENCOUNTER — Other Ambulatory Visit (HOSPITAL_COMMUNITY): Payer: Self-pay

## 2013-04-01 ENCOUNTER — Other Ambulatory Visit (HOSPITAL_COMMUNITY): Payer: Self-pay

## 2014-09-13 ENCOUNTER — Encounter (HOSPITAL_COMMUNITY): Payer: Self-pay | Admitting: *Deleted

## 2015-12-22 ENCOUNTER — Emergency Department (HOSPITAL_COMMUNITY)
Admission: EM | Admit: 2015-12-22 | Discharge: 2015-12-23 | Disposition: A | Discharge status: Other Acute Inpatient Hospital | Payer: Self-pay | Attending: Emergency Medicine | Admitting: Emergency Medicine

## 2015-12-22 ENCOUNTER — Encounter (HOSPITAL_COMMUNITY): Payer: Self-pay | Admitting: Emergency Medicine

## 2015-12-22 DIAGNOSIS — F1510 Other stimulant abuse, uncomplicated: Secondary | ICD-10-CM | POA: Insufficient documentation

## 2015-12-22 DIAGNOSIS — F17210 Nicotine dependence, cigarettes, uncomplicated: Secondary | ICD-10-CM | POA: Insufficient documentation

## 2015-12-22 DIAGNOSIS — Z3202 Encounter for pregnancy test, result negative: Secondary | ICD-10-CM | POA: Insufficient documentation

## 2015-12-22 DIAGNOSIS — R110 Nausea: Secondary | ICD-10-CM | POA: Insufficient documentation

## 2015-12-22 DIAGNOSIS — F1310 Sedative, hypnotic or anxiolytic abuse, uncomplicated: Secondary | ICD-10-CM | POA: Insufficient documentation

## 2015-12-22 DIAGNOSIS — Z915 Personal history of self-harm: Secondary | ICD-10-CM | POA: Insufficient documentation

## 2015-12-22 DIAGNOSIS — R61 Generalized hyperhidrosis: Secondary | ICD-10-CM | POA: Insufficient documentation

## 2015-12-22 DIAGNOSIS — F1020 Alcohol dependence, uncomplicated: Secondary | ICD-10-CM | POA: Insufficient documentation

## 2015-12-22 DIAGNOSIS — R6883 Chills (without fever): Secondary | ICD-10-CM | POA: Insufficient documentation

## 2015-12-22 DIAGNOSIS — Z8781 Personal history of (healed) traumatic fracture: Secondary | ICD-10-CM | POA: Insufficient documentation

## 2015-12-22 HISTORY — DX: Bipolar disorder, unspecified: F31.9

## 2015-12-22 LAB — COMPREHENSIVE METABOLIC PANEL
ALBUMIN: 4.3 g/dL (ref 3.5–5.0)
ALK PHOS: 133 U/L — AB (ref 38–126)
ALT: 18 U/L (ref 14–54)
ANION GAP: 9 (ref 5–15)
AST: 24 U/L (ref 15–41)
BILIRUBIN TOTAL: 0.8 mg/dL (ref 0.3–1.2)
BUN: 13 mg/dL (ref 6–20)
CO2: 23 mmol/L (ref 22–32)
CREATININE: 0.74 mg/dL (ref 0.44–1.00)
Calcium: 8.9 mg/dL (ref 8.9–10.3)
Chloride: 106 mmol/L (ref 101–111)
GFR calc Af Amer: >60 mL/min (ref >60)
GLUCOSE: 100 mg/dL — AB (ref 65–99)
Potassium: 3.7 mmol/L (ref 3.5–5.1)
Sodium: 138 mmol/L (ref 135–145)
Total Protein: 7.1 g/dL (ref 6.5–8.1)

## 2015-12-22 LAB — CBC
HEMATOCRIT: 39.6 % (ref 36.0–46.0)
HEMOGLOBIN: 13.1 g/dL (ref 12.0–15.0)
MCH: 32.1 pg (ref 26.0–34.0)
MCHC: 33.1 g/dL (ref 30.0–36.0)
MCV: 97.1 fL (ref 78.0–100.0)
Platelets: 294 10*3/uL (ref 150–400)
RBC: 4.08 MIL/uL (ref 3.87–5.11)
RDW: 12 % (ref 11.5–15.5)
WBC: 6.1 10*3/uL (ref 4.0–10.5)

## 2015-12-22 LAB — RAPID URINE DRUG SCREEN, HOSP PERFORMED
AMPHETAMINES: POSITIVE — AB
BARBITURATES: NOT DETECTED
BENZODIAZEPINES: POSITIVE — AB
COCAINE: NOT DETECTED
Opiates: NOT DETECTED
TETRAHYDROCANNABINOL: NOT DETECTED

## 2015-12-22 LAB — PREGNANCY, URINE: PREG TEST UR: NEGATIVE

## 2015-12-22 LAB — ETHANOL

## 2015-12-22 MED ORDER — VITAMIN B-1 100 MG PO TABS
100.0000 mg | ORAL_TABLET | Freq: Every day | ORAL | Status: DC
  Administered 2015-12-22: 100 mg via ORAL
  Filled 2015-12-22: qty 1

## 2015-12-22 MED ORDER — NICOTINE 21 MG/24HR TD PT24
21.0000 mg | MEDICATED_PATCH | Freq: Every day | TRANSDERMAL | Status: DC
  Administered 2015-12-22: 21 mg via TRANSDERMAL
  Filled 2015-12-22: qty 1

## 2015-12-22 MED ORDER — LORAZEPAM 1 MG PO TABS
0.0000 mg | ORAL_TABLET | Freq: Two times a day (BID) | ORAL | Status: DC
  Filled 2015-12-22: qty 2

## 2015-12-22 MED ORDER — LORAZEPAM 1 MG PO TABS
0.0000 mg | ORAL_TABLET | Freq: Four times a day (QID) | ORAL | Status: DC
  Administered 2015-12-22 – 2015-12-23 (×2): 2 mg via ORAL
  Filled 2015-12-22: qty 2

## 2015-12-22 MED ORDER — LORAZEPAM 2 MG/ML IJ SOLN: 0.0000 mg | Freq: Four times a day (QID) | INTRAMUSCULAR | Status: DC

## 2015-12-22 MED ORDER — LORAZEPAM 2 MG/ML IJ SOLN: 0.0000 mg | Freq: Two times a day (BID) | INTRAMUSCULAR | Status: DC

## 2015-12-22 MED ORDER — THIAMINE HCL 100 MG/ML IJ SOLN: 100.0000 mg | Freq: Every day | INTRAMUSCULAR | Status: DC
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
## 2015-12-22 NOTE — ED Notes (Addendum)
Patient denies SI, HI and AVH at this time. Patient states she is here for detox. Plan of care discussed with patient. Patient voices no complaints or concerns at this time. Encouragement and support provided and safety maintain. Q 15 min safety checks in place.

## 2015-12-22 NOTE — ED Notes (Signed)
Telepsych in progress. 
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
## 2015-12-23 ENCOUNTER — Encounter (HOSPITAL_COMMUNITY): Payer: Self-pay | Admitting: Behavioral Health

## 2015-12-23 ENCOUNTER — Inpatient Hospital Stay (HOSPITAL_COMMUNITY)
Admission: EM | Admit: 2015-12-23 | Discharge: 2015-12-31 | DRG: 885 | Disposition: A | Discharge status: Home / Self Care | Payer: Federal, State, Local not specified - Other | Source: Intra-hospital | Attending: Psychiatry | Admitting: Psychiatry

## 2015-12-23 DIAGNOSIS — F411 Generalized anxiety disorder: Secondary | ICD-10-CM | POA: Diagnosis present

## 2015-12-23 DIAGNOSIS — F1410 Cocaine abuse, uncomplicated: Secondary | ICD-10-CM | POA: Diagnosis present

## 2015-12-23 DIAGNOSIS — F1910 Other psychoactive substance abuse, uncomplicated: Secondary | ICD-10-CM

## 2015-12-23 DIAGNOSIS — F1020 Alcohol dependence, uncomplicated: Secondary | ICD-10-CM

## 2015-12-23 DIAGNOSIS — F331 Major depressive disorder, recurrent, moderate: Secondary | ICD-10-CM | POA: Diagnosis present

## 2015-12-23 DIAGNOSIS — F17210 Nicotine dependence, cigarettes, uncomplicated: Secondary | ICD-10-CM | POA: Diagnosis present

## 2015-12-23 DIAGNOSIS — F10239 Alcohol dependence with withdrawal, unspecified: Secondary | ICD-10-CM | POA: Diagnosis present

## 2015-12-23 DIAGNOSIS — F329 Major depressive disorder, single episode, unspecified: Secondary | ICD-10-CM | POA: Diagnosis present

## 2015-12-23 DIAGNOSIS — A599 Trichomoniasis, unspecified: Secondary | ICD-10-CM | POA: Diagnosis present

## 2015-12-23 DIAGNOSIS — F10129 Alcohol abuse with intoxication, unspecified: Secondary | ICD-10-CM | POA: Diagnosis present

## 2015-12-23 MED ORDER — LOPERAMIDE HCL 2 MG PO CAPS: 2.0000 mg | ORAL_CAPSULE | ORAL | Status: DC | PRN

## 2015-12-23 MED ORDER — ADULT MULTIVITAMIN W/MINERALS CH: 1.0000 | ORAL_TABLET | Freq: Every day | ORAL | Status: DC

## 2015-12-23 MED ORDER — LORAZEPAM 1 MG PO TABS
1.0000 mg | ORAL_TABLET | Freq: Three times a day (TID) | ORAL | Status: DC
  Administered 2015-12-24 (×2): 1 mg via ORAL
  Filled 2015-12-23 (×2): qty 1

## 2015-12-23 MED ORDER — LORAZEPAM 1 MG PO TABS: 1.0000 mg | ORAL_TABLET | Freq: Every day | ORAL | Status: DC

## 2015-12-23 MED ORDER — VITAMIN B-1 100 MG PO TABS
100.0000 mg | ORAL_TABLET | Freq: Every day | ORAL | Status: DC
  Administered 2015-12-24 – 2015-12-31 (×8): 100 mg via ORAL
  Filled 2015-12-23 (×10): qty 1

## 2015-12-23 MED ORDER — LORAZEPAM 1 MG PO TABS: 1.0000 mg | ORAL_TABLET | Freq: Two times a day (BID) | ORAL | Status: DC

## 2015-12-23 MED ORDER — ADULT MULTIVITAMIN W/MINERALS CH
1.0000 | ORAL_TABLET | Freq: Every day | ORAL | Status: DC
  Administered 2015-12-23 – 2015-12-31 (×9): 1 via ORAL
  Filled 2015-12-23 (×5): qty 1
  Filled 2015-12-23: qty 21
  Filled 2015-12-23 (×5): qty 1
  Filled 2015-12-23: qty 21

## 2015-12-23 MED ORDER — HYDROXYZINE HCL 25 MG PO TABS
25.0000 mg | ORAL_TABLET | Freq: Four times a day (QID) | ORAL | Status: DC | PRN
  Filled 2015-12-23: qty 1

## 2015-12-23 MED ORDER — ACETAMINOPHEN 325 MG PO TABS
650.0000 mg | ORAL_TABLET | Freq: Four times a day (QID) | ORAL | Status: DC | PRN
  Administered 2015-12-23 – 2015-12-29 (×2): 650 mg via ORAL
  Filled 2015-12-23 (×2): qty 2

## 2015-12-23 MED ORDER — MAGNESIUM HYDROXIDE 400 MG/5ML PO SUSP
30.0000 mL | Freq: Every day | ORAL | Status: DC | PRN
  Administered 2015-12-27 (×2): 30 mL via ORAL
  Filled 2015-12-23 (×2): qty 30

## 2015-12-23 MED ORDER — LORAZEPAM 1 MG PO TABS
1.0000 mg | ORAL_TABLET | Freq: Four times a day (QID) | ORAL | Status: DC | PRN
  Administered 2015-12-23: 1 mg via ORAL
  Filled 2015-12-23: qty 1

## 2015-12-23 MED ORDER — LORAZEPAM 1 MG PO TABS
1.0000 mg | ORAL_TABLET | Freq: Four times a day (QID) | ORAL | Status: AC
  Administered 2015-12-23: 1 mg via ORAL
  Filled 2015-12-23 (×2): qty 1

## 2015-12-23 MED ORDER — VITAMIN B-1 100 MG PO TABS: 100.0000 mg | ORAL_TABLET | Freq: Every day | ORAL | Status: DC

## 2015-12-23 MED ORDER — TRAZODONE HCL 100 MG PO TABS
100.0000 mg | ORAL_TABLET | Freq: Every day | ORAL | Status: DC
  Filled 2015-12-23: qty 1

## 2015-12-23 MED ORDER — ONDANSETRON 4 MG PO TBDP: 4.0000 mg | ORAL_TABLET | Freq: Four times a day (QID) | ORAL | Status: DC | PRN

## 2015-12-23 MED ORDER — HYDROXYZINE HCL 25 MG PO TABS: 25.0000 mg | ORAL_TABLET | Freq: Four times a day (QID) | ORAL | Status: DC | PRN

## 2015-12-23 MED ORDER — ONDANSETRON 4 MG PO TBDP
4.0000 mg | ORAL_TABLET | Freq: Four times a day (QID) | ORAL | Status: DC | PRN
Start: 2015-12-23 — End: 2015-12-24

## 2015-12-23 MED ORDER — DULOXETINE HCL 60 MG PO CPEP
60.0000 mg | ORAL_CAPSULE | Freq: Every day | ORAL | Status: DC
  Administered 2015-12-23 – 2015-12-24 (×2): 60 mg via ORAL
  Filled 2015-12-23 (×4): qty 1

## 2015-12-23 MED ORDER — QUETIAPINE FUMARATE 50 MG PO TABS
50.0000 mg | ORAL_TABLET | Freq: Every day | ORAL | Status: DC
  Administered 2015-12-23 – 2015-12-24 (×2): 50 mg via ORAL
  Filled 2015-12-23 (×4): qty 1

## 2015-12-23 MED ORDER — THIAMINE HCL 100 MG/ML IJ SOLN: 100.0000 mg | Freq: Once | INTRAMUSCULAR | Status: AC

## 2015-12-23 MED ORDER — LORAZEPAM 1 MG PO TABS
ORAL_TABLET | ORAL | Status: AC
  Filled 2015-12-23: qty 1

## 2015-12-23 MED ORDER — LORAZEPAM 1 MG PO TABS
1.0000 mg | ORAL_TABLET | Freq: Four times a day (QID) | ORAL | Status: DC | PRN
Start: 2015-12-23 — End: 2015-12-23
  Administered 2015-12-23: 1 mg via ORAL
  Filled 2015-12-23: qty 1

## 2015-12-23 MED ORDER — ALUM & MAG HYDROXIDE-SIMETH 200-200-20 MG/5ML PO SUSP: 30.0000 mL | ORAL | Status: DC | PRN

## 2015-12-23 NOTE — ED Notes (Signed)
Writer asked patient if she had a seizure disorder or if she ever had a seizure before. Patient states "No I do not have seizures but i take seizure medication as a mood stabilizer.

## 2015-12-23 NOTE — Progress Notes (Signed)
Pt did not attend AA group this evening.  

## 2015-12-23 NOTE — Tx Team (Signed)
Initial Interdisciplinary Treatment Plan   PATIENT STRESSORS: Marital or family conflict Medication change or noncompliance Substance abuse   PATIENT STRENGTHS: Ability for insight Average or above average intelligence Capable of independent living   PROBLEM LIST: Problem List/Patient Goals Date to be addressed Date deferred Reason deferred Estimated date of resolution  "i need to stop drinking" 12/23/15     "I need to get away from my boyfriend" 12/23/15     "I should be back on my meds" 12/23/15                                          DISCHARGE CRITERIA:  Ability to meet basic life and health needs Adequate post-discharge living arrangements Improved stabilization in mood, thinking, and/or behavior Withdrawal symptoms are absent or subacute and managed without 24-hour nursing intervention  PRELIMINARY DISCHARGE PLAN: Attend aftercare/continuing care group Outpatient therapy  PATIENT/FAMIILY INVOLVEMENT: This treatment plan has been presented to and reviewed with the patient, [name].  The patient and family have been given the opportunity to ask questions and make suggestions.  [name] 12/23/2015, 5:15 PM
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
## 2015-12-23 NOTE — Progress Notes (Signed)
Sarah Ellis denies SI/HI/AVH at this time. She is complaining of restless legs with trazodone and vistaril.  She is requesting a sleep aid tonight. New orders given. Encouraged [name] to work on goals in order to evaluate her progression or lack thereof. Medications administered as prescribed. Continue to monitor Q 15 minutes for patient safety and medication effectiveness.

## 2015-12-23 NOTE — Progress Notes (Signed)
[facility] INPATIENT:  Family/Significant Other Suicide Prevention Education  Suicide Prevention Education:  Patient Refusal for Family/Significant Other Suicide Prevention Education: The patient Sarah Ellis has refused to provide written consent for family/significant other to be provided Family/Significant Other Suicide Prevention Education during admission and/or prior to discharge.    Angeline Slim M 12/23/2015, 5:53 AM

## 2015-12-23 NOTE — ED Notes (Signed)
Pt transported to [facility], OBS unit  by [facility] transportation service for continuation of specialized care. Patient had no belongings and signed stating the same. Pt left in no acute distress.
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
## 2015-12-23 NOTE — Progress Notes (Signed)
[facility] Observation Crisis Plan  Reason for Crisis Plan:  Medication Management and Substance Abuse   Plan of Care:    Family Support:    Mother  Current Living Environment:   Homeless  Insurance:   Hospital Account    Name Acct ID Class Status Primary Coverage   Sarah Ellis, Sarah Ellis [id number] BEHAVIORAL HEALTH OBSERVATION Open None        Guarantor Account (for Hospital Account [id number])    Name Relation to Pt Service Area Active? Acct Type   Sarah Ellis Self [facility] Yes Behavioral Health   Address Phone       [address] [place], [place] [address] [phone](H)          Coverage Information (for Hospital Account [id number])    Not on file      Legal Guardian:     Primary Care Provider:  No primary care provider on file.  Current Outpatient Providers:  none  Psychiatrist:     Counselor/Therapist:     Compliant with Medications:  No  Additional Information:   Sarah Ellis 2/10/20175:53 AM

## 2015-12-24 ENCOUNTER — Encounter (HOSPITAL_COMMUNITY): Payer: Self-pay | Admitting: Psychiatry

## 2015-12-24 DIAGNOSIS — F1410 Cocaine abuse, uncomplicated: Secondary | ICD-10-CM

## 2015-12-24 DIAGNOSIS — F331 Major depressive disorder, recurrent, moderate: Principal | ICD-10-CM

## 2015-12-24 DIAGNOSIS — F1020 Alcohol dependence, uncomplicated: Secondary | ICD-10-CM

## 2015-12-24 MED ORDER — CHLORDIAZEPOXIDE HCL 25 MG PO CAPS
25.0000 mg | ORAL_CAPSULE | Freq: Three times a day (TID) | ORAL | Status: AC
  Administered 2015-12-25 – 2015-12-26 (×3): 25 mg via ORAL
  Filled 2015-12-24 (×3): qty 1

## 2015-12-24 MED ORDER — CHLORDIAZEPOXIDE HCL 25 MG PO CAPS
25.0000 mg | ORAL_CAPSULE | Freq: Every day | ORAL | Status: AC
  Administered 2015-12-28: 25 mg via ORAL
  Filled 2015-12-24: qty 1

## 2015-12-24 MED ORDER — LOPERAMIDE HCL 2 MG PO CAPS: 2.0000 mg | ORAL_CAPSULE | ORAL | Status: AC | PRN

## 2015-12-24 MED ORDER — CHLORDIAZEPOXIDE HCL 25 MG PO CAPS
25.0000 mg | ORAL_CAPSULE | Freq: Four times a day (QID) | ORAL | Status: AC
  Administered 2015-12-24 – 2015-12-25 (×4): 25 mg via ORAL
  Filled 2015-12-24 (×5): qty 1

## 2015-12-24 MED ORDER — THIAMINE HCL 100 MG/ML IJ SOLN
100.0000 mg | Freq: Once | INTRAMUSCULAR | Status: AC
  Administered 2015-12-24: 100 mg via INTRAMUSCULAR
  Filled 2015-12-24: qty 2

## 2015-12-24 MED ORDER — CHLORDIAZEPOXIDE HCL 25 MG PO CAPS
25.0000 mg | ORAL_CAPSULE | ORAL | Status: AC
  Administered 2015-12-26 – 2015-12-27 (×2): 25 mg via ORAL
  Filled 2015-12-24 (×2): qty 1

## 2015-12-24 MED ORDER — GABAPENTIN 300 MG PO CAPS
300.0000 mg | ORAL_CAPSULE | Freq: Three times a day (TID) | ORAL | Status: DC
  Administered 2015-12-24 – 2015-12-26 (×6): 300 mg via ORAL
  Filled 2015-12-24 (×11): qty 1

## 2015-12-24 MED ORDER — CHLORDIAZEPOXIDE HCL 25 MG PO CAPS
25.0000 mg | ORAL_CAPSULE | Freq: Four times a day (QID) | ORAL | Status: AC | PRN
  Administered 2015-12-25 – 2015-12-27 (×4): 25 mg via ORAL
  Filled 2015-12-24 (×4): qty 1

## 2015-12-24 MED ORDER — CHLORDIAZEPOXIDE HCL 25 MG PO CAPS
25.0000 mg | ORAL_CAPSULE | Freq: Once | ORAL | Status: AC
  Administered 2015-12-24: 25 mg via ORAL
  Filled 2015-12-24: qty 1

## 2015-12-24 MED ORDER — NICOTINE 21 MG/24HR TD PT24
21.0000 mg | MEDICATED_PATCH | Freq: Every day | TRANSDERMAL | Status: DC
  Administered 2015-12-24 – 2015-12-31 (×8): 21 mg via TRANSDERMAL
  Filled 2015-12-24 (×11): qty 1

## 2015-12-24 MED ORDER — DULOXETINE HCL 20 MG PO CPEP
20.0000 mg | ORAL_CAPSULE | Freq: Every day | ORAL | Status: DC
  Administered 2015-12-25 – 2015-12-27 (×3): 20 mg via ORAL
  Filled 2015-12-24 (×5): qty 1

## 2015-12-24 NOTE — [facility] Suicide Risk Assessment (Addendum)
[facility] INPATIENT:  Family/Significant   Other Suicide Prevention Education  Pt did not express suicidal ideation at admission.  She declined to give permission for collateral contact with anyone in her life.  Suicide Prevention Education:  Patient Refusal for Family/Significant Other Suicide Prevention Education: The patient Sarah Ellis has refused to provide written consent for family/significant other to be provided Family/Significant Other Suicide Prevention Education during admission and/or prior to discharge.  Physician notified.  Sarina Ser 12/24/2015, 4:14 PM
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
## 2015-12-24 NOTE — [facility] Group Notes (Signed)
[facility] Group Notes: (Clinical Social Work)   12/24/2015      Type of Therapy:  Group Therapy   Participation Level:  Did Not Attend despite MHT prompting   Ambrose Mantle, LCSW 12/24/2015, 12:19 PM
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
## 2015-12-24 NOTE — Progress Notes (Signed)
Patient attended wrap up group. Rated her day a 1 due to the withdrawals she is having while trying to sleep. Goal is to try to get clean.

## 2015-12-24 NOTE — [facility] Counselor (Signed)
[facility] application given to pt to complete.  Ambrose Mantle, LCSW 12/24/2015, 4:20 PM

## 2015-12-24 NOTE — [facility] Suicide Risk Assessment (Signed)
[facility] Admission Suicide Risk Assessment   Nursing information obtained from:    Demographic factors:    Current Mental Status:    Loss Factors:    Historical Factors:    Risk Reduction Factors:     Total Time spent with patient: 30 minutes Principal Problem: Alcohol use disorder, severe, dependence (HCC) Diagnosis:   Patient Active Problem List   Diagnosis Date Noted  . Major depressive disorder, recurrent episode, moderate with anxious distress (HCC) [F33.1] 12/23/2015  . Alcohol use disorder, severe, dependence (HCC) [F10.20] 12/23/2015  . Cocaine abuse [F14.10] 01/31/2013    Class: Acute   Subjective Data: Pt states " I am withdrawing , I have a lot of tremors. I was also feeling depressed.'   Continued Clinical Symptoms:  Alcohol Use Disorder Identification Test Final Score (AUDIT): 36 The "Alcohol Use Disorders Identification Test", Guidelines for Use in Primary Care, Second Edition.  World Science writer [facility]). Score between 0-7:  no or low risk or alcohol related problems. Score between 8-15:  moderate risk of alcohol related problems. Score between 16-19:  high risk of alcohol related problems. Score 20 or above:  warrants further diagnostic evaluation for alcohol dependence and treatment.   CLINICAL FACTORS:   Depression:   Comorbid alcohol abuse/dependence Alcohol/Substance Abuse/Dependencies Unstable or Poor Therapeutic Relationship Previous Psychiatric Diagnoses and Treatments   Musculoskeletal: Strength & Muscle Tone: within normal limits Gait & Station: normal Patient leans: N/A  Psychiatric Specialty Exam: Review of Systems  Psychiatric/Behavioral: Positive for depression and substance abuse. The patient is nervous/anxious.   All other systems reviewed and are negative.   Blood pressure 108/62, pulse 75, temperature 98 F (36.7 C), temperature source Oral, resp. rate 16, height  (1.651 m), weight 60.328 kg (133 lb), last menstrual period  12/11/2015, SpO2 100 %.Body mass index is 22.13 kg/(m^2).  General Appearance: Disheveled  Eye Solicitor::  Fair  Speech:  Clear and Coherent  Volume:  Decreased  Mood:  Anxious and Depressed  Affect:  Congruent  Thought Process:  Coherent  Orientation:  Other:  person,place  Thought Content:  Rumination  Suicidal Thoughts:  No  Homicidal Thoughts:  No  Memory:  Immediate;   Fair Recent;   Fair Remote;   Fair  Judgement:  Impaired  Insight:  Shallow  Psychomotor Activity:  Increased and Restlessness  Concentration:  Poor  Recall:  [employer] of Knowledge:Fair  Language: Fair  Akathisia:  No    AIMS (if indicated):     Assets:  Desire for Improvement  Sleep:  Number of Hours: 6.75  Cognition: WNL  ADL's:  Intact    COGNITIVE FEATURES THAT CONTRIBUTE TO RISK:  Closed-mindedness, Polarized thinking and Thought constriction (tunnel vision)    SUICIDE RISK:   Moderate:  Frequent suicidal ideation with limited intensity, and duration, some specificity in terms of plans, no associated intent, good self-control, limited dysphoria/symptomatology, some risk factors present, and identifiable protective factors, including available and accessible social support.  PLAN OF CARE: Patient will benefit from inpatient treatment and stabilization.  Estimated length of stay is 5-7 days.  Reviewed past medical records,treatment plan.  Will continue CIWA protocol. Will continue Cymbalta 60 mg po daily for affective sx. Will continue Seroquel 50 mg po qhs for mood sx/sleep. Case discussed with NP Shuvon - please also see H&P. Will continue to monitor vitals ,medication compliance and treatment side effects while patient is here.  Will monitor for medical issues as well as call consult as needed.  CSW will start working on disposition.  Patient to participate in therapeutic milieu .       I certify that inpatient services furnished can reasonably be expected to improve the patient's  condition.   Jonnae Fonseca, MD 12/24/2015, 12:15 PM

## 2015-12-24 NOTE — Progress Notes (Signed)
Sarah Ellis is endorsing restless legs, tremors, sweating, chills and cramps. Rates Anxiety 9/10 and Depression 10/10.  Denies SI/HI/AVH.  Encouragement and support given. Medications administered as prescribed. Continue to monitor Q 15 minutes for patient safety and medication effectiveness.

## 2015-12-24 NOTE — Progress Notes (Signed)
D: Patient denies SI/HI/AVH.   Patient affect and mood are anxious.  Patient has visible tremors in her BUE.  Patient visible on the milieu. No distress noted. A: Support and encouragement offered. Scheduled medications given to pt. Q 15 min checks continued for patient safety. R: Patient receptive. Patient remains safe on the unit.

## 2015-12-25 LAB — LIPID PANEL
Cholesterol: 208 mg/dL — ABNORMAL HIGH (ref 0–200)
HDL: 93 mg/dL (ref >40)
LDL Cholesterol: 95 mg/dL (ref 0–99)
Total CHOL/HDL Ratio: 2.2 RATIO
Triglycerides: 102 mg/dL (ref <150)
VLDL: 20 mg/dL (ref 0–40)

## 2015-12-25 LAB — HIV ANTIBODY (ROUTINE TESTING W REFLEX): HIV SCREEN 4TH GENERATION: NONREACTIVE

## 2015-12-25 MED ORDER — MIRTAZAPINE 7.5 MG PO TABS
7.5000 mg | ORAL_TABLET | Freq: Every day | ORAL | Status: DC
  Administered 2015-12-25 – 2015-12-26 (×2): 7.5 mg via ORAL
  Filled 2015-12-25 (×4): qty 1

## 2015-12-25 MED ORDER — AZITHROMYCIN 500 MG PO TABS
1000.0000 mg | ORAL_TABLET | Freq: Once | ORAL | Status: AC
  Administered 2015-12-25: 1000 mg via ORAL
  Filled 2015-12-25: qty 4
  Filled 2015-12-25: qty 2

## 2015-12-25 NOTE — Progress Notes (Signed)
Pt attended AA group this evening.  
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
## 2015-12-25 NOTE — [facility] Group Notes (Signed)
[facility] Group Notes:  (Nursing/MHT/Case Management/Adjunct)  Date:  12/25/2015  Time:  1:15 pm  Type of Therapy:  Nurse Education  Participation Level:  Active  Participation Quality:  Appropriate, Attentive, Sharing and Supportive  Affect:  Appropriate  Cognitive:  Alert and Oriented  Insight:  Appropriate  Engagement in Group:  Engaged and Supportive  Modes of Intervention:  Discussion and Education  Summary of Progress/Problems:  Group topic was [employer].  Discussed importance of setting daily goals.  She was attentive and supportive of others during the group.    Norm Parcel Yuan Gann 12/25/2015, 2:13 PM

## 2015-12-25 NOTE — Progress Notes (Signed)
DAR NOTE: Pt present with flat affect and depressed mood in the unit. Pt has been observed interacting with peer in the dayroom. Pt denies physical pain, took all her meds as scheduled. As per self inventory, pt had a poor night sleep, poor appetite, low energy, and poor concentration. Pt rate depression at 10, hopeless ness at 4, and anxiety at 10. Pt's safety ensured with 15 minute and environmental checks. Pt currently denies SI/HI and A/V hallucinations. Pt verbally agrees to seek staff if SI/HI or A/VH occurs and to consult with staff before acting on these thoughts. Will continue POC.

## 2015-12-25 NOTE — [facility] Group Notes (Signed)
[facility] Group Notes: (Clinical Social Work)   12/25/2015      Type of Therapy:  Group Therapy   Participation Level:  Did Not Attend despite MHT prompting   Ambrose Mantle, LCSW 12/25/2015, 1:18 PM

## 2015-12-26 ENCOUNTER — Encounter (HOSPITAL_COMMUNITY): Payer: Self-pay | Admitting: Psychiatry

## 2015-12-26 DIAGNOSIS — F411 Generalized anxiety disorder: Secondary | ICD-10-CM | POA: Diagnosis present

## 2015-12-26 LAB — GC/CHLAMYDIA PROBE AMP (~~LOC~~) NOT AT ARMC
Chlamydia: NEGATIVE
NEISSERIA GONORRHEA: NEGATIVE
Trichomonas: POSITIVE — AB

## 2015-12-26 LAB — HEMOGLOBIN A1C
HEMOGLOBIN A1C: 5.2 % (ref 4.8–5.6)
MEAN PLASMA GLUCOSE: 103 mg/dL

## 2015-12-26 MED ORDER — RISPERIDONE 0.25 MG PO TABS
0.2500 mg | ORAL_TABLET | Freq: Four times a day (QID) | ORAL | Status: DC | PRN
  Administered 2015-12-26: 0.25 mg via ORAL
  Filled 2015-12-26: qty 1

## 2015-12-26 MED ORDER — ACAMPROSATE CALCIUM 333 MG PO TBEC
666.0000 mg | DELAYED_RELEASE_TABLET | Freq: Three times a day (TID) | ORAL | Status: DC
  Administered 2015-12-26 – 2015-12-29 (×10): 666 mg via ORAL
  Filled 2015-12-26 (×13): qty 2

## 2015-12-26 MED ORDER — CARBAMAZEPINE ER 200 MG PO CP12
200.0000 mg | ORAL_CAPSULE | Freq: Two times a day (BID) | ORAL | Status: DC
  Administered 2015-12-26 – 2015-12-31 (×10): 200 mg via ORAL
  Filled 2015-12-26 (×3): qty 1
  Filled 2015-12-26: qty 42
  Filled 2015-12-26: qty 1
  Filled 2015-12-26 (×3): qty 42
  Filled 2015-12-26 (×8): qty 1

## 2015-12-26 MED ORDER — RISPERIDONE 0.25 MG PO TABS: 0.2500 mg | ORAL_TABLET | Freq: Three times a day (TID) | ORAL | Status: DC | PRN

## 2015-12-26 MED ORDER — CARBAMAZEPINE ER 200 MG PO CP12: 200.0000 mg | ORAL_CAPSULE | Freq: Two times a day (BID) | ORAL | Status: DC

## 2015-12-26 NOTE — Progress Notes (Signed)
D Pt. Denies SI and HI, no complaints of pain or discomfort noted at present time.  A Writer offered support and encouragement, discussed coping skills with pt  R Pt. Rated her day a 5, her anxiety a 9, and her depression a 9.   Pt. Remains safe on the unit.

## 2015-12-26 NOTE — Progress Notes (Signed)
D    Pt spent most of the shift in bed   She said she did not feel like coming to group or to get medications and requested her medications be brought to her    She is depressed and sad   She complained o11f some stomach upset and wanted crackers and ginger ale A    Verbal support given   Medications administered and effectiveness monitored   Q 15 min checks R   Pt safe at present

## 2015-12-26 NOTE — Progress Notes (Signed)
Adult Psychoeducational Group Note  Date:  12/26/2015 Time:  9:38 PM  Group Topic/Focus:  Wrap-Up Group:   The focus of this group is to help patients review their daily goal of treatment and discuss progress on daily workbooks.  Participation Level:  Did Not Attend  Participation Quality:  Did not attend  Affect:  Did not attend  Cognitive:  Did not attend  Insight: None  Engagement in Group:  Did not attend  Modes of Intervention:  Did not attend  Additional Comments:  Patient did not attend wrap up group tonight.   Adin Laker L Klint Lezcano 12/26/2015, 9:38 PM

## 2015-12-26 NOTE — [facility] Group Notes (Signed)
[facility] LCSW Aftercare Discharge Planning Group Note   12/26/2015 9:44 AM  Participation Quality:  Invited. DID NOT ATTEND. Pt chose to remain in bed.   Smart, Bryna Razavi LCSW

## 2015-12-26 NOTE — [facility] Group Notes (Signed)
[facility] LCSW Group Therapy  12/26/2015 1:19 PM  Type of Therapy:  Group Therapy  Participation Level:  Active  Participation Quality:  Attentive  Affect:  Appropriate  Cognitive:  Alert and Oriented  Insight:  Engaged  Engagement in Therapy:  Improving  Modes of Intervention:  Confrontation, Discussion, Education, Exploration, Problem-solving, Rapport Building, Socialization and Support  Summary of Progress/Problems: Today's Topic: Overcoming Obstacles. Patients identified one short term goal and potential obstacles in reaching this goal. Patients processed barriers involved in overcoming these obstacles. Patients identified steps necessary for overcoming these obstacles and explored motivation (internal and external) for facing these difficulties head on. Sarah Ellis was attentive and engaged during today's processing group. She shared that her biggest obstacle invovles getting into a treatment center. "I really want to go to [facility] and [facility] house from there." Sarah Ellis stated that she gave temporary custody of her 3 children to her mother in law and plans to "get myself together" before attempting to regain custody of her children. She requested AA/NA information in addition to information about [facility]. CSW provided.   Smart, Sarah Kedzierski LCSW 12/26/2015, 1:19 PM
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
## 2015-12-26 NOTE — Progress Notes (Signed)
DAR NOTE: Pt present with flat affect and depressed mood on the unit. Pt has been observed in the day room with peers, but has not been interacting much. Pt stated she had poor sleep last night, has poor appetite, low energy and poor concentration. Pt rates depression at 8, hopelessness at 2, and anxiety at 10. Pt denies physical pain at this time, took all her medications without problems. Pt's safety ensured with 15 minute and environmental checks. Pt currently denies SI/HI and A/V hallucinations. Pt verbally agrees to seek staff if SI/HI or A/VH occurs and to consult with staff before acting on these thoughts. Will continue POC.
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
## 2015-12-27 MED ORDER — BUSPIRONE HCL 10 MG PO TABS
10.0000 mg | ORAL_TABLET | Freq: Three times a day (TID) | ORAL | Status: DC
  Administered 2015-12-27 – 2015-12-31 (×12): 10 mg via ORAL
  Filled 2015-12-27: qty 1
  Filled 2015-12-27: qty 63
  Filled 2015-12-27 (×3): qty 1
  Filled 2015-12-27: qty 63
  Filled 2015-12-27 (×6): qty 1
  Filled 2015-12-27: qty 2
  Filled 2015-12-27: qty 1
  Filled 2015-12-27: qty 63
  Filled 2015-12-27 (×2): qty 1
  Filled 2015-12-27: qty 63
  Filled 2015-12-27: qty 1
  Filled 2015-12-27 (×2): qty 63

## 2015-12-27 MED ORDER — RISPERIDONE 0.5 MG PO TABS
0.5000 mg | ORAL_TABLET | Freq: Four times a day (QID) | ORAL | Status: DC | PRN
  Administered 2015-12-27: 0.5 mg via ORAL
  Filled 2015-12-27: qty 1

## 2015-12-27 MED ORDER — RISPERIDONE 1 MG PO TABS
1.0000 mg | ORAL_TABLET | Freq: Four times a day (QID) | ORAL | Status: DC | PRN
  Administered 2015-12-27 – 2015-12-30 (×6): 1 mg via ORAL
  Filled 2015-12-27: qty 1
  Filled 2015-12-27: qty 20
  Filled 2015-12-27 (×5): qty 1

## 2015-12-27 MED ORDER — DULOXETINE HCL 30 MG PO CPEP
30.0000 mg | ORAL_CAPSULE | Freq: Every day | ORAL | Status: DC
  Administered 2015-12-28 – 2015-12-29 (×2): 30 mg via ORAL
  Filled 2015-12-27 (×3): qty 1

## 2015-12-27 MED ORDER — ZOLPIDEM TARTRATE 10 MG PO TABS
10.0000 mg | ORAL_TABLET | Freq: Every evening | ORAL | Status: DC | PRN
  Administered 2015-12-27 – 2015-12-29 (×3): 10 mg via ORAL
  Filled 2015-12-27 (×3): qty 1

## 2015-12-27 NOTE — Plan of Care (Signed)
Problem: Consults Goal: Depression Patient Education See Patient Education Module for education specifics.  Outcome: Progressing Nurse discussed depression/coping skills with patient.        
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
## 2015-12-27 NOTE — Progress Notes (Signed)
Patient did attend the evening speaker AA meeting.  

## 2015-12-27 NOTE — Progress Notes (Signed)
Recreation Therapy Notes  Animal-Assisted Activity (AAA) Program Checklist/Progress Notes Patient Eligibility Criteria Checklist & Daily Group note for Rec Tx Intervention  Date: 02.14.2017 Time: 2:45pm Location: [address] Dayroom    AAA/T Program Assumption of Risk Form signed by Patient/ or Parent Legal Guardian yes  Patient is free of allergies or sever asthma yes  Patient reports no fear of animals yes  Patient reports no history of cruelty to animals yes  Patient understands his/her participation is voluntary yes  Behavioral Response: Did not attend.   Renell Allum L Denijah Karrer, LRT/CTRS  Gerard Cantara L 12/27/2015 3:15 PM 

## 2015-12-27 NOTE — Progress Notes (Signed)
Patient refused EKG tonight.  Will ask patient again later.

## 2015-12-27 NOTE — [facility] Group Notes (Signed)
[facility] LCSW Group Therapy  12/27/2015 3:40 PM  Type of Therapy:  Group Therapy  Participation Level:  Active  Participation Quality:  Attentive  Affect:  Appropriate  Cognitive:  Alert and Oriented  Insight:  Improving  Engagement in Therapy:  Improving  Modes of Intervention:  Confrontation, Discussion, Education, Exploration, Problem-solving, Rapport Building, Socialization and Support  Summary of Progress/Problems: MHA Speaker came to talk about his personal journey with substance abuse and addiction. The pt processed ways by which to relate to the speaker. MHA speaker provided handouts and educational information pertaining to groups and services offered by the [facility].   Smart, Sarah Allegretto LCSW 12/27/2015, 3:40 PM

## 2015-12-27 NOTE — Progress Notes (Signed)
D:  Patient's self inventory sheet, patient has poor sleep, no sleep medication given.  Fair appetite, low energy level, poor concentration.  Rated depression and anxiety 10, hopeless 8.  Withdrawals, tremors, chilling, agitation, nausea, runny nose, irritability.  Denied SI.  Physical problems pain, worst pain in past 24 hours is #5, foot, neck, R ribs, no pain, meds.  Goal is to get her mind right.  Plans to talk to MD again.  Does have discharge plans.  [facility]ing on bed at [facility]. A:  Medications administered per MD orders.  Emotional support and encouragement given patient. R:  Denied SI and HI, contracts for safety.  Denied A/V hallucinations.  Safety maintained with 15 minute checks.

## 2015-12-27 NOTE — Progress Notes (Addendum)
Adult Psychoeducational Group Note    Adult Psychoeducational Group Note  Date:  12/27/2015 Time:  0900  Group Topic/Focus:  Orientation:   The focus of this group is to educate the patient on the purpose and policies of crisis stabilization and provide a format to answer questions about their admission.  The group details unit policies and expectations of patients while admitted.  Participation Level:  Did Not Attend  Participation Quality:  n/a  Affect:  n/a  Cognitive:  n/a  Insight: n/a Engagement in Group:  n/a  Modes of Intervention:  n/a  Additional Comments:  n/a  Sarah Ellis 12/27/2015, 10:25 AM

## 2015-12-28 MED ORDER — MAGNESIUM CITRATE PO SOLN
1.0000 | Freq: Once | ORAL | Status: AC
  Administered 2015-12-28: 1 via ORAL

## 2015-12-28 MED ORDER — DOCUSATE SODIUM 100 MG PO CAPS
100.0000 mg | ORAL_CAPSULE | Freq: Two times a day (BID) | ORAL | Status: DC
  Administered 2015-12-28 – 2015-12-31 (×6): 100 mg via ORAL
  Filled 2015-12-28 (×4): qty 1
  Filled 2015-12-28 (×2): qty 42
  Filled 2015-12-28 (×4): qty 1
  Filled 2015-12-28 (×2): qty 42

## 2015-12-28 NOTE — [facility] Group Notes (Signed)
[facility] LCSW Group Therapy  12/28/2015 1:13 PM  Type of Therapy:  Group Therapy  Participation Level:  Did Not Attend-pt chose to remain in bed.   Modes of Intervention:  Confrontation, Discussion, Education, Exploration, Problem-solving, Rapport Building, Socialization and Support  Summary of Progress/Problems: Emotion Regulation: This group focused on both positive and negative emotion identification and allowed group members to process ways to identify feelings, regulate negative emotions, and find healthy ways to manage internal/external emotions. Group members were asked to reflect on a time when their reaction to an emotion led to a negative outcome and explored how alternative responses using emotion regulation would have benefited them. Group members were also asked to discuss a time when emotion regulation was utilized when a negative emotion was experienced.   Ellis, Sarah Alvira LCSW 12/28/2015, 1:13 PM

## 2015-12-28 NOTE — Progress Notes (Signed)
Pt attended evening NA group.  

## 2015-12-28 NOTE — Progress Notes (Signed)
Patient ID: Sarah Ellis, female   DOB: 1981-07-29, 35 y.o.   MRN: [id number] D:Affect is flat/sad, mood is depressed. Says that her goal today is to work with SW on getting into [facility]. Rates her depression and anxiety very high but has been visible and participating in the milieu. A:Support and encouragement offered. R:Receptive. No complaints at this time.

## 2015-12-28 NOTE — Progress Notes (Signed)
Recreation Therapy Notes  Date: 02.17.2017 Time: 9:30am Location: [address] Group Room   Group Topic: Stress Management  Goal Area(s) Addresses:  Patient will actively participate in stress management techniques presented during session.   Behavioral Response: Appropriate, Engaged   Intervention: Stress management techniques  Activity :  Deep Breathing and Guided Imagery. LRT provided instruction and demonstration on practice of Guided Imagery. Technique was coupled with deep breathing.   Education:  Stress Management, Discharge Planning.   Education Outcome: Acknowledges education  Clinical Observations/Feedback: Patient actively engaged in technique introduced, expressed no concerns and demonstrated ability to practice independently post d/c.   Marykay Lex Margareth Kanner, LRT/CTRS  Jearl Klinefelter 12/28/2015 1:57 PM

## 2015-12-28 NOTE — Progress Notes (Signed)
D    Pt has improved this evening   She came to the medication window for medicine and was visible on the milieu   She endorses some mild to moderate withdrawal symptoms    Her mood is more positive and she has smiled someduring interaction A   Verbal support given   Medications administered and effectiveness monitored   Q 15 min checks R    Pt safe at present and receptive to verbal support
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
## 2015-12-28 NOTE — [facility] Group Notes (Signed)
[facility] LCSW Aftercare Discharge Planning Group Note   12/28/2015 9:27 AM  Participation Quality:  Appropriate   Mood/Affect:  Appropriate  Depression Rating:  7  Anxiety Rating:  9  Thoughts of Suicide:  No Will you contract for safety?   NA  Current AVH:  No  Plan for Discharge/Comments:  Pt reports that she is interested in a ministry program called [facility] and plans to call them this morning. Pt is on waitlist for [facility] as well. Pt reports high anxiety; minimal withdrawals this morning.   Transportation Means: unknown at this time   Supports: "very limited" family supports   Counselling psychologist, Conservation officer, nature

## 2015-12-29 MED ORDER — POLYETHYLENE GLYCOL 3350 17 G PO PACK
17.0000 g | PACK | Freq: Every day | ORAL | Status: DC
  Administered 2015-12-29 – 2015-12-30 (×2): 17 g via ORAL
  Filled 2015-12-29 (×5): qty 1

## 2015-12-29 MED ORDER — DULOXETINE HCL 20 MG PO CPEP
40.0000 mg | ORAL_CAPSULE | Freq: Every day | ORAL | Status: DC
  Administered 2015-12-30: 40 mg via ORAL
  Filled 2015-12-29 (×3): qty 2

## 2015-12-29 MED ORDER — MAGNESIUM CITRATE PO SOLN
1.0000 | Freq: Once | ORAL | Status: AC
  Administered 2015-12-29: 1 via ORAL
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
## 2015-12-29 NOTE — Progress Notes (Signed)
[facility] Group Notes:  (Nursing/MHT/Case Management/Adjunct)  Date:  12/29/2015  Time:  2045 Type of Therapy:  wrap up group  Participation Level:  Active  Participation Quality:  Appropriate, Attentive, Sharing and Supportive  Affect:  Flat  Cognitive:  Appropriate  Insight:  Lacking  Engagement in Group:  Engaged  Modes of Intervention:  Clarification, Education and Support  Summary of Progress/Problems: Pt shared that she had been to [facility] in the past which is a one year treatment program. Pt reported that she has had very little clean time and her addiction has been ongoing for many years. Pt does share that this is the first time she is doing it for herself and not because someone had forced her to or because treatment was court ordered.   Sarah Ellis 12/29/2015, 9:36 PM

## 2015-12-29 NOTE — [facility] Group Notes (Signed)

## 2015-12-29 NOTE — Progress Notes (Signed)
EKG COMPLETED PER MD ORDER.  

## 2015-12-29 NOTE — [facility] Group Notes (Signed)
[facility] LCSW Group Therapy  12/29/2015 2:56 PM  Type of Therapy:  Group Therapy  Participation Level:  Did Not Attend-pt was meeting with doctor during afternoon group.   Summary of Progress/Problems:  Finding Balance in Life. Today's group focused on defining balance in one's own words, identifying things that can knock one off balance, and exploring healthy ways to maintain balance in life. Group members were asked to provide an example of a time when they felt off balance, describe how they handled that situation,and process healthier ways to regain balance in the future. Group members were asked to share the most important tool for maintaining balance that they learned while at [facility] and how they plan to apply this method after discharge.   Smart, Danyell Shader LCSW 12/29/2015, 2:56 PM

## 2015-12-29 NOTE — Plan of Care (Signed)
Problem: Alteration in mood Goal: LTG-Patient reports reduction in suicidal thoughts (Patient reports reduction in suicidal thoughts and is able to verbalize a safety plan for whenever patient is feeling suicidal)  Outcome: Progressing Nurse discussed depression, suicidal thoughts, coping skills with patient.        

## 2015-12-29 NOTE — Progress Notes (Signed)
D:  Patient's self inventory sheet, patient sleeps good, sleep medication is helpful.  Fair appetite, low energy level, good concentration.  Rated depression and anxiety 8, hopeless 4.  Denied withdrawals.  Denied SI.  Physical problems, headaches, tummy.  Pain medication is helpful.  Goal is to go to [facility] and take medications, concerned about insurance.  Plans to talk to SW.  Will discuss discharge plans with staff.  A:  Medications administered per MD orders.  Emotional support and encouragement given patient. R:  Denied SI and HI.  Denied A/V hallucinations.  Contracts for safety.  Safety maintained with 15 minute checks.

## 2015-12-29 NOTE — Progress Notes (Signed)
D    Pt has improved this evening   She came to the medication window for medicine and was visible on the milieu   She endorses some mild to moderate withdrawal symptoms    Her mood is more positive and she has smiled someduring interaction A   Verbal support given   Medications administered and effectiveness monitored   Q 15 min checks R    Pt safe at present and receptive to verbal support 

## 2015-12-30 MED ORDER — DULOXETINE HCL 60 MG PO CPEP
60.0000 mg | ORAL_CAPSULE | Freq: Every day | ORAL | Status: DC
  Administered 2015-12-31: 60 mg via ORAL
  Filled 2015-12-30 (×2): qty 1
  Filled 2015-12-30 (×2): qty 21

## 2015-12-30 MED ORDER — DOXEPIN HCL 25 MG PO CAPS
25.0000 mg | ORAL_CAPSULE | Freq: Every day | ORAL | Status: DC
  Administered 2015-12-30: 25 mg via ORAL
  Filled 2015-12-30: qty 1
  Filled 2015-12-30 (×2): qty 21
  Filled 2015-12-30 (×2): qty 1

## 2015-12-30 MED ORDER — POLYETHYLENE GLYCOL 3350 17 G PO PACK
17.0000 g | PACK | Freq: Every day | ORAL | Status: DC
  Administered 2015-12-30 – 2015-12-31 (×3): 17 g via ORAL
  Filled 2015-12-30: qty 21
  Filled 2015-12-30 (×4): qty 1
  Filled 2015-12-30: qty 21

## 2015-12-30 MED ORDER — NALTREXONE HCL 50 MG PO TABS
25.0000 mg | ORAL_TABLET | Freq: Every day | ORAL | Status: DC
  Administered 2015-12-31: 25 mg via ORAL
  Filled 2015-12-30: qty 11
  Filled 2015-12-30 (×4): qty 1
  Filled 2015-12-30: qty 11

## 2015-12-30 MED ORDER — POLYETHYLENE GLYCOL 3350 17 G PO PACK: 17.0000 g | PACK | ORAL | Status: DC | PRN

## 2015-12-30 MED ORDER — MAGNESIUM CITRATE PO SOLN
1.0000 | Freq: Once | ORAL | Status: AC
  Administered 2015-12-30: 1 via ORAL

## 2015-12-30 NOTE — Progress Notes (Signed)
Pt attended evening AA group. 

## 2015-12-30 NOTE — Progress Notes (Signed)
D: Sarah Ellis reported still feeling tired this a.m., though she said she slept well after taking Ambien. She denied SI/HI/AVH and pain. On her self inventory sheet, she reported fair appetite, low energy, and good concentration. She rated her depression 5, hopelessness 4, and anxiety 9. She reported eagerness to get a bed at [facility]. Pt appeared sleepy and dysphoric. She verbalized no complaints or concerns, though she was given an opportunity to do so. A: Meds given as ordered, including PRN Risperdal for anxiety with results pending. Q15 safety checks maintained. Support/encouragement offered. R: Pt remains free from harm and continues with treatment. Will continue to monitor for needs/safety.

## 2015-12-30 NOTE — Progress Notes (Signed)
Recreation Therapy Notes   Date: 02.17.2017 Time: 9:30am Location: 300 Hall Group Room   Group Topic: Stress Management  Goal Area(s) Addresses:  Patient will actively participate in stress management techniques presented during session.   Behavioral Response: Did not attend.   Reign Bartnick L Byard Carranza, LRT/CTRS         Starling Christofferson L 12/30/2015 2:58 PM 

## 2015-12-30 NOTE — Progress Notes (Signed)
D: Patient alert and oriented x 4. Patient denies pain/SI/HI/AVH. Patient requested Ambien for insomnia.  PRN given at 2213.  A: Staff to monitor Q 15 mins for safety. Encouragement and support offered. Scheduled medications administered per orders. R: Patient remains safe on the unit. Patient attended group tonight. Patient visible on hte unit and interacting with peers. Patient taking administered medications.

## 2015-12-30 NOTE — Progress Notes (Signed)
  [facility] Adult Case Management Discharge Plan :  Will you be returning to the same living situation after discharge:  Yes,  home with mother until [facility] sceening on Tuesday morning. At discharge, do you have transportation home?: Yes,  pt's mother will pick up pt on Saturday around 6pm (after dinner).  Do you have the ability to pay for your medications: Yes,  mental health  Release of information consent forms completed and submitted to medical records by CSW.  Patient to Follow up at: Follow-up Information    Follow up with [facility].   Why:  Referral Sent: 12/26/2015. Please call Shayla Monday at 9:00AM to check status of referral if still interested in this facility. Thank you.    Contact information:   [address] [place], [place] [address] Phone: [phone] Fax: [phone]      Follow up with [facility]  On 01/03/2016.   Why:  Screening for possible admission on this date at 8:00AM. Please bring proof of [place] address and 2 week supply of medication. Thank you.   Contact information:   [address] [place], [place] [address] Phone: [phone] Fax: [phone]      Follow up with [facility]   Why:  Walk in between 8am-9am Monday through Friday for hospital follow-up/medication management/assessment for counseling services.    Contact information:   [address], [place] [address] Phone: [phone] Fax: [phone]      Next level of care provider has access to [facility] Link:no  Safety Planning and Suicide Prevention discussed: Yes,  SPE completed with pt, as she refused to consent to family contact. SPI pamphlet and mobile crisis information provided to pt and she was encouraged to share this information with support network.   Have you used any form of tobacco in the last 30 days? (Cigarettes, Smokeless Tobacco, Cigars, and/or Pipes): Yes  Has patient been referred to the Quitline?: Patient refused referral  Patient  has been referred for addiction treatment: Yes-see above   Smart, Kaiesha Tonner LCSW 12/30/2015, 2:51 PM

## 2015-12-30 NOTE — [facility] Group Notes (Signed)
[facility] LCSW Group Therapy  12/30/2015 1:11 PM  Type of Therapy:  Group Therapy  Participation Level:  Active  Participation Quality:  Attentive  Affect:  Appropriate  Cognitive:  Alert and Oriented  Insight:  Improving  Engagement in Therapy:  Improving  Modes of Intervention:  Confrontation, Discussion, Education, Exploration, Problem-solving, Rapport Building, Socialization and Support  Summary of Progress/Problems: Feelings around Relapse. Group members discussed the meaning of relapse and shared personal stories of relapse, how it affected them and others, and how they perceived themselves during this time. Group members were encouraged to identify triggers, warning signs and coping skills used when facing the possibility of relapse. Social supports were discussed and explored in detail.  Sarah Ellis was attentive and engaged during today's processing group. She shared that to her relapse elicits feelings of "guilt, anger, failure, and a sense of weakness." Pt reports that her husband's recent imprisonment and the temporary loss of custody of her children were primary triggers for her alcohol relapse. "My goal is to get as much sober time as possible under my belt before I return to be with my family."   Smart, Herbert Seta LCSW 12/30/2015, 1:11 PM

## 2015-12-30 NOTE — [facility] Group Notes (Signed)
[facility] LCSW Aftercare Discharge Planning Group Note   12/30/2015 9:32 AM  Participation Quality:  Invited. DID NOT ATTEND. Pt chose to remain in bed.   Smart, Venera Privott LCSW
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
## 2015-12-30 NOTE — Progress Notes (Signed)
D: Patient alert and oriented x 4. Patient denies pain/SI/HI/AVH. Patient upset at mother. Patient states, "I am to be discharged tomorrow evening, but my mom now is wanting me to stay until Monday. I really don't know what to do."  This writer advised patient to talk with doctor tomorrow.  A: Staff to monitor Q 15 mins for safety. Encouragement and support offered. Scheduled medications administered per orders. R: Patient remains safe on the unit. Patient attended group tonight. Patient visible on hte unit and interacting with peers. Patient taking administered medications.

## 2015-12-30 NOTE — Progress Notes (Signed)
CSW left message for Shayla to check status of referral. CSW left 2nd message for Tyshon Academic librarian) at [facility] to check status of referral and attempt to schedule screening for admission.  Trula Slade, MSW, LCSW Clinical Social Worker 12/30/2015 10:08 AM

## 2015-12-30 NOTE — Plan of Care (Signed)
Problem: Alteration in mood Goal: LTG-Patient reports reduction in suicidal thoughts (Patient reports reduction in suicidal thoughts and is able to verbalize a safety plan for whenever patient is feeling suicidal)  Outcome: Progressing Sarah Ellis denies SI.  Problem: Ineffective individual coping Goal: STG-Increase in ability to manage activities of daily living Outcome: Adequate for Discharge She's independent with ADLs.

## 2015-12-31 DIAGNOSIS — F411 Generalized anxiety disorder: Secondary | ICD-10-CM

## 2015-12-31 LAB — HEPATITIS PANEL, ACUTE
HEP A IGM: NEGATIVE
Hep B C IgM: NEGATIVE
Hepatitis B Surface Ag: NEGATIVE

## 2015-12-31 MED ORDER — RISPERIDONE 1 MG PO TABS: 1.0000 mg | ORAL_TABLET | Freq: Four times a day (QID) | ORAL | Status: DC | PRN

## 2015-12-31 MED ORDER — DULOXETINE HCL 60 MG PO CPEP: 60.0000 mg | ORAL_CAPSULE | Freq: Every day | ORAL | Status: DC

## 2015-12-31 MED ORDER — CARBAMAZEPINE ER 200 MG PO CP12: 200.0000 mg | ORAL_CAPSULE | Freq: Two times a day (BID) | ORAL | Status: DC

## 2015-12-31 MED ORDER — ADULT MULTIVITAMIN W/MINERALS CH: 1.0000 | ORAL_TABLET | Freq: Every day | ORAL | Status: DC

## 2015-12-31 MED ORDER — DOXEPIN HCL 25 MG PO CAPS: 25.0000 mg | ORAL_CAPSULE | Freq: Every day | ORAL | Status: DC

## 2015-12-31 MED ORDER — BUSPIRONE HCL 10 MG PO TABS: 10.0000 mg | ORAL_TABLET | Freq: Three times a day (TID) | ORAL | Status: DC

## 2015-12-31 MED ORDER — NALTREXONE HCL 50 MG PO TABS: 25.0000 mg | ORAL_TABLET | Freq: Every day | ORAL | Status: DC

## 2015-12-31 MED ORDER — NICOTINE 21 MG/24HR TD PT24: 21.0000 mg | MEDICATED_PATCH | Freq: Every day | TRANSDERMAL | Status: DC

## 2015-12-31 NOTE — BHH Suicide Risk Assessment (Signed)
[facility] Discharge Suicide Risk Assessment   Principal Problem: Major depressive disorder, recurrent episode, moderate with anxious distress [facility]) Discharge Diagnoses:  Patient Active Problem List   Diagnosis Date Noted  . GAD (generalized anxiety disorder) [F41.1] 12/26/2015  . Major depressive disorder, recurrent episode, moderate with anxious distress (HCC) [F33.1] 12/23/2015  . Alcohol use disorder, severe, dependence (HCC) [F10.20] 12/23/2015  . Cocaine abuse [F14.10] 01/31/2013    Class: Acute    Total Time spent with patient: 30 minutes  Musculoskeletal: Strength & Muscle Tone: within normal limits Gait & Station: normal Patient leans: N/A  Psychiatric Specialty Exam: Review of Systems  Constitutional: Negative for fever.  Cardiovascular: Negative for chest pain.  Skin: Negative for rash.    Blood pressure 114/74, pulse 80, temperature 98.5 F (36.9 C), temperature source Oral, resp. rate 16, height  (1.651 m), weight 60.328 kg (133 lb), last menstrual period 12/11/2015, SpO2 100 %.Body mass index is 22.13 kg/(m^2).  General Appearance: Casual  Eye Contact::  Fair  Speech:  Normal Rate409  Volume:  Normal  Mood:  Euthymic  Affect:  Congruent  Thought Process:  Coherent  Orientation:  Full (Time, Place, and Person)  Thought Content:  Rumination  Suicidal Thoughts:  No  Homicidal Thoughts:  No  Memory:  Immediate;   Fair Recent;   Fair  Judgement:  Fair  Insight:  Fair  Psychomotor Activity:  Normal  Concentration:  Fair  Recall:  [employer] of Knowledge:Fair  Language: Fair  Akathisia:  Negative  Handed:  Right  AIMS (if indicated):     Assets:  Desire for Improvement  Sleep:  Number of Hours: 6  Cognition: WNL  ADL's:  Intact   Mental Status Per Nursing Assessment::   On Admission:     Demographic Factors:  Low socioeconomic status  Loss Factors: Financial problems/change in socioeconomic status  Historical Factors: Impulsivity  Risk  Reduction Factors:   Sense of responsibility to family and Positive therapeutic relationship  Continued Clinical Symptoms:  Depression:   Insomnia Alcohol/Substance Abuse/Dependencies Previous Psychiatric Diagnoses and Treatments  Cognitive Features That Contribute To Risk:  None    Suicide Risk:  Minimal: No identifiable suicidal ideation.  Patients presenting with no risk factors but with morbid ruminations; may be classified as minimal risk based on the severity of the depressive symptoms  Follow-up Information    Follow up with [facility].   Why:  Referral Sent: 12/26/2015. Please call Shayla Monday at 9:00AM to check status of referral if still interested in this facility. Thank you.    Contact information:   [address] [place], [place] [address] Phone: [phone] Fax: [phone]      Follow up with [facility]  On 01/03/2016.   Why:  Screening for possible admission on this date at 8:00AM. Please bring proof of [place] address and 2 week supply of medication. Thank you.   Contact information:   [address] [place], [place] [address] Phone: [phone] Fax: [phone]      Follow up with [facility].   Why:  Walk in between 8am-9am Monday through Friday for hospital follow-up/medication management/assessment for counseling services.    Contact information:   [address], [place] [address] Phone: [phone] Fax: [phone]    will increase doxepin to  considering insomnia and  did not help.  Follow up with all above recommendations and follow ups. No withdrawal symptoms No suicidal toughts.   Plan Of Care/Follow-up recommendations:  Activity:  as tolerated Diet:  regular  Gilmore Laroche, Deatrice Spanbauer, MD 12/31/2015, 10:20 AM

## 2015-12-31 NOTE — Progress Notes (Signed)
Pt was given her DC paperwork and it is reviewed with her, she was given written cc of MD's SRA, cc of the DC AVS and cc of the AVS transition form. She was given sample meds as well as prescriptions for the meds and the  Her f/u plans were reviewed with her and she said : " You dont have to worry...ibuprofen gotta get in [facility]... She completed her daily assessment and on it she wrote she denied SI and she rated her depression, hopelessness and anxiety as " 6/2/8/", respectively . Sh was escorted to blllllldg entrance and she was dc'd to care of female friend.
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
## 2015-12-31 NOTE — [facility] Group Notes (Signed)
[facility] Group Notes:  (Nursing/MHT/Case Management/Adjunct)  Date:  12/31/2015  Time:  1315  Type of Therapy:  Nurse Education  /  Life SKills : The focus of the group is on teaching the patients how to identify their needs as well as how to develop healthy coping skills ...needed to get their needs met.  Participation Level:  Active  Participation Quality:  Appropriate  Affect:  Appropriate  Cognitive:  Appropriate  Insight:  Improving  Engagement in Group:  Engaged  Modes of Intervention:  Discussion and Education  Summary of Progress/Problems:  Sarah Ellis 12/31/2015, 4:34 PM

## 2015-12-31 NOTE — [facility] Group Notes (Signed)
[facility] Group Notes:  (Clinical Social Work)   09/10/2015     10:00-11:00AM  Summary of Progress/Problems:   In today's process group patients listed needs that human beings have, then listed healthy and unhealthy coping techniques, determining that unhealthy coping techniques work initially, but eventually become harmful.  There was then a discussion of various ideas of healthy coping techniques and how to go about switching from unhealthy to healthy choices.  Motivational Interviewing and the whiteboard were utilized for the exercises.  The patient expressed that the unhealthy coping she often uses is drinking in order to comfort herself because her husband has just been sent to prison for 5 years and she has 3 small children to take care of.  She showed growing insight.  Type of Therapy:  Group Therapy - Process   Participation Level:  Active  Participation Quality:  Attentive and Sharing  Affect:  Blunted  Cognitive:  Appropriate  Insight:  Developing/Improving  Engagement in Therapy:  Engaged  Modes of Intervention:  Education, Motivational Interviewing  Ambrose Mantle, LCSW 12/31/2015, 12:17 PM

## 2016-01-16 ENCOUNTER — Emergency Department (HOSPITAL_COMMUNITY)
Admission: EM | Admit: 2016-01-16 | Discharge: 2016-01-17 | Disposition: A | Discharge status: Other Acute Inpatient Hospital | Payer: Self-pay | Attending: Emergency Medicine | Admitting: Emergency Medicine

## 2016-01-16 ENCOUNTER — Encounter (HOSPITAL_COMMUNITY): Payer: Self-pay | Admitting: Emergency Medicine

## 2016-01-16 DIAGNOSIS — Z79899 Other long term (current) drug therapy: Secondary | ICD-10-CM | POA: Insufficient documentation

## 2016-01-16 DIAGNOSIS — Z8781 Personal history of (healed) traumatic fracture: Secondary | ICD-10-CM | POA: Insufficient documentation

## 2016-01-16 DIAGNOSIS — F17210 Nicotine dependence, cigarettes, uncomplicated: Secondary | ICD-10-CM | POA: Insufficient documentation

## 2016-01-16 DIAGNOSIS — M797 Fibromyalgia: Secondary | ICD-10-CM | POA: Insufficient documentation

## 2016-01-16 DIAGNOSIS — F319 Bipolar disorder, unspecified: Secondary | ICD-10-CM | POA: Insufficient documentation

## 2016-01-16 DIAGNOSIS — F1010 Alcohol abuse, uncomplicated: Secondary | ICD-10-CM | POA: Insufficient documentation

## 2016-01-16 LAB — COMPREHENSIVE METABOLIC PANEL
ALK PHOS: 101 U/L (ref 38–126)
ALT: 15 U/L (ref 14–54)
AST: 19 U/L (ref 15–41)
Albumin: 4.1 g/dL (ref 3.5–5.0)
Anion gap: 12 (ref 5–15)
BUN: 14 mg/dL (ref 6–20)
CALCIUM: 8.9 mg/dL (ref 8.9–10.3)
CO2: 23 mmol/L (ref 22–32)
CREATININE: 0.65 mg/dL (ref 0.44–1.00)
Chloride: 105 mmol/L (ref 101–111)
Glucose, Bld: 103 mg/dL — ABNORMAL HIGH (ref 65–99)
Potassium: 4.3 mmol/L (ref 3.5–5.1)
Sodium: 140 mmol/L (ref 135–145)
TOTAL PROTEIN: 7.5 g/dL (ref 6.5–8.1)
Total Bilirubin: 0.3 mg/dL (ref 0.3–1.2)

## 2016-01-16 LAB — RAPID URINE DRUG SCREEN, HOSP PERFORMED
Amphetamines: NOT DETECTED
Barbiturates: NOT DETECTED
Benzodiazepines: NOT DETECTED
Cocaine: NOT DETECTED
OPIATES: NOT DETECTED
TETRAHYDROCANNABINOL: NOT DETECTED

## 2016-01-16 LAB — CBC
HCT: 40.5 % (ref 36.0–46.0)
Hemoglobin: 13.5 g/dL (ref 12.0–15.0)
MCH: 31.9 pg (ref 26.0–34.0)
MCHC: 33.3 g/dL (ref 30.0–36.0)
MCV: 95.7 fL (ref 78.0–100.0)
PLATELETS: 309 10*3/uL (ref 150–400)
RBC: 4.23 MIL/uL (ref 3.87–5.11)
RDW: 11.9 % (ref 11.5–15.5)
WBC: 7 10*3/uL (ref 4.0–10.5)

## 2016-01-16 LAB — ETHANOL: ALCOHOL ETHYL (B): 232 mg/dL — AB (ref <5)

## 2016-01-16 LAB — SALICYLATE LEVEL

## 2016-01-16 LAB — ACETAMINOPHEN LEVEL: Acetaminophen (Tylenol), Serum: <10 ug/mL — ABNORMAL LOW (ref 10–30)

## 2016-01-16 MED ORDER — LORAZEPAM 1 MG PO TABS
1.0000 mg | ORAL_TABLET | Freq: Once | ORAL | Status: AC
  Administered 2016-01-16: 1 mg via ORAL
  Filled 2016-01-16: qty 1

## 2016-01-16 MED ORDER — THIAMINE HCL 100 MG/ML IJ SOLN: 100.0000 mg | Freq: Every day | INTRAMUSCULAR | Status: DC

## 2016-01-16 MED ORDER — FOLIC ACID 1 MG PO TABS
1.0000 mg | ORAL_TABLET | Freq: Once | ORAL | Status: AC
  Administered 2016-01-16: 1 mg via ORAL
  Filled 2016-01-16: qty 1

## 2016-01-16 MED ORDER — VITAMIN B-1 100 MG PO TABS
100.0000 mg | ORAL_TABLET | Freq: Every day | ORAL | Status: DC
  Administered 2016-01-16 – 2016-01-17 (×2): 100 mg via ORAL
  Filled 2016-01-16 (×2): qty 1

## 2016-01-16 NOTE — ED Notes (Signed)
Pt sleeping, no acute distress noted.  Mother remains at bedside

## 2016-01-16 NOTE — ED Notes (Signed)
Pt requesting a ginger ale, pt asking how long before she sees a dr, states she feels a panic attack coming on.  Pt appears calm and in no acute distress. Pt ambulates to bathroom with no assistance needed.  Mother remains at bedside.
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
## 2016-01-16 NOTE — ED Notes (Signed)
Pt in conference room on tele psych.

## 2016-01-16 NOTE — ED Notes (Signed)
DO NOT GIVE ANY LAB RESULTS IN FRONT OF PT'S MOTHER PER PT 'S REQUEST

## 2016-01-16 NOTE — ED Notes (Signed)
[place] sandwich and soda brought to pt.

## 2016-01-16 NOTE — ED Notes (Signed)
Pt sleeping but ambulated to restroom on own right before going to sleep.  Mother remains at bedside.

## 2016-01-16 NOTE — ED Notes (Signed)
[facility]. Pt states she is shaking (no shaking noted in triage). Pt states her last drink was shortly before coming to ED.
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
## 2016-01-16 NOTE — ED Notes (Signed)
PA at bedside for pt eval.

## 2016-01-17 ENCOUNTER — Encounter (HOSPITAL_COMMUNITY): Payer: Self-pay

## 2016-01-17 ENCOUNTER — Inpatient Hospital Stay (HOSPITAL_COMMUNITY)
Admission: AD | Admit: 2016-01-17 | Discharge: 2016-01-24 | DRG: 885 | Disposition: A | Discharge status: Home / Self Care | Payer: Federal, State, Local not specified - Other | Source: Intra-hospital | Attending: Psychiatry | Admitting: Psychiatry

## 2016-01-17 DIAGNOSIS — F1020 Alcohol dependence, uncomplicated: Secondary | ICD-10-CM | POA: Diagnosis present

## 2016-01-17 DIAGNOSIS — G4700 Insomnia, unspecified: Secondary | ICD-10-CM | POA: Diagnosis present

## 2016-01-17 DIAGNOSIS — Z23 Encounter for immunization: Secondary | ICD-10-CM

## 2016-01-17 DIAGNOSIS — Z818 Family history of other mental and behavioral disorders: Secondary | ICD-10-CM | POA: Diagnosis not present

## 2016-01-17 DIAGNOSIS — K5900 Constipation, unspecified: Secondary | ICD-10-CM | POA: Diagnosis present

## 2016-01-17 DIAGNOSIS — F1520 Other stimulant dependence, uncomplicated: Secondary | ICD-10-CM | POA: Diagnosis present

## 2016-01-17 DIAGNOSIS — M549 Dorsalgia, unspecified: Secondary | ICD-10-CM | POA: Diagnosis present

## 2016-01-17 DIAGNOSIS — F331 Major depressive disorder, recurrent, moderate: Principal | ICD-10-CM | POA: Diagnosis present

## 2016-01-17 DIAGNOSIS — F411 Generalized anxiety disorder: Secondary | ICD-10-CM | POA: Diagnosis present

## 2016-01-17 DIAGNOSIS — F17210 Nicotine dependence, cigarettes, uncomplicated: Secondary | ICD-10-CM | POA: Diagnosis present

## 2016-01-17 DIAGNOSIS — IMO0002 Reserved for concepts with insufficient information to code with codable children: Secondary | ICD-10-CM | POA: Diagnosis present

## 2016-01-17 DIAGNOSIS — Z79899 Other long term (current) drug therapy: Secondary | ICD-10-CM

## 2016-01-17 DIAGNOSIS — Z811 Family history of alcohol abuse and dependence: Secondary | ICD-10-CM

## 2016-01-17 DIAGNOSIS — F1090 Alcohol use, unspecified, uncomplicated: Secondary | ICD-10-CM | POA: Diagnosis present

## 2016-01-17 DIAGNOSIS — M797 Fibromyalgia: Secondary | ICD-10-CM | POA: Diagnosis present

## 2016-01-17 MED ORDER — PNEUMOCOCCAL VAC POLYVALENT 25 MCG/0.5ML IJ INJ
0.5000 mL | INJECTION | INTRAMUSCULAR | Status: AC
  Administered 2016-01-23: 0.5 mL via INTRAMUSCULAR

## 2016-01-17 MED ORDER — DULOXETINE HCL 60 MG PO CPEP
60.0000 mg | ORAL_CAPSULE | Freq: Every day | ORAL | Status: DC
  Administered 2016-01-18: 60 mg via ORAL
  Filled 2016-01-17 (×3): qty 1

## 2016-01-17 MED ORDER — BUSPIRONE HCL 10 MG PO TABS
10.0000 mg | ORAL_TABLET | Freq: Three times a day (TID) | ORAL | Status: DC
  Administered 2016-01-17 – 2016-01-18 (×3): 10 mg via ORAL
  Filled 2016-01-17 (×5): qty 1
  Filled 2016-01-17: qty 2
  Filled 2016-01-17: qty 1

## 2016-01-17 MED ORDER — ONDANSETRON 4 MG PO TBDP: 4.0000 mg | ORAL_TABLET | Freq: Four times a day (QID) | ORAL | Status: AC | PRN

## 2016-01-17 MED ORDER — CARBAMAZEPINE ER 200 MG PO CP12
200.0000 mg | ORAL_CAPSULE | Freq: Two times a day (BID) | ORAL | Status: DC
  Administered 2016-01-17 – 2016-01-18 (×2): 200 mg via ORAL
  Filled 2016-01-17 (×6): qty 1

## 2016-01-17 MED ORDER — NICOTINE 21 MG/24HR TD PT24
21.0000 mg | MEDICATED_PATCH | Freq: Every day | TRANSDERMAL | Status: DC
  Administered 2016-01-18 – 2016-01-22 (×5): 21 mg via TRANSDERMAL
  Filled 2016-01-17 (×5): qty 1
  Filled 2016-01-17: qty 14
  Filled 2016-01-17 (×3): qty 1

## 2016-01-17 MED ORDER — BUSPIRONE HCL 10 MG PO TABS
10.0000 mg | ORAL_TABLET | Freq: Three times a day (TID) | ORAL | Status: DC
  Administered 2016-01-17: 10 mg via ORAL
  Filled 2016-01-17: qty 1

## 2016-01-17 MED ORDER — DOXEPIN HCL 25 MG PO CAPS
25.0000 mg | ORAL_CAPSULE | Freq: Every day | ORAL | Status: DC
  Administered 2016-01-17 – 2016-01-19 (×3): 25 mg via ORAL
  Filled 2016-01-17 (×5): qty 1

## 2016-01-17 MED ORDER — RISPERIDONE 1 MG PO TABS
1.0000 mg | ORAL_TABLET | Freq: Four times a day (QID) | ORAL | Status: DC | PRN
  Administered 2016-01-17: 1 mg via ORAL
  Filled 2016-01-17: qty 1

## 2016-01-17 MED ORDER — NALTREXONE HCL 50 MG PO TABS
25.0000 mg | ORAL_TABLET | Freq: Every day | ORAL | Status: DC
  Administered 2016-01-17: 25 mg via ORAL
  Filled 2016-01-17: qty 1

## 2016-01-17 MED ORDER — HYDROXYZINE HCL 25 MG PO TABS
25.0000 mg | ORAL_TABLET | Freq: Four times a day (QID) | ORAL | Status: DC | PRN
  Administered 2016-01-17: 25 mg via ORAL

## 2016-01-17 MED ORDER — ADULT MULTIVITAMIN W/MINERALS CH
1.0000 | ORAL_TABLET | Freq: Every day | ORAL | Status: DC
  Administered 2016-01-18 – 2016-01-23 (×6): 1 via ORAL
  Filled 2016-01-17: qty 1
  Filled 2016-01-17: qty 14
  Filled 2016-01-17 (×8): qty 1

## 2016-01-17 MED ORDER — LOPERAMIDE HCL 2 MG PO CAPS: 2.0000 mg | ORAL_CAPSULE | ORAL | Status: AC | PRN

## 2016-01-17 MED ORDER — NALTREXONE HCL 50 MG PO TABS
25.0000 mg | ORAL_TABLET | Freq: Every day | ORAL | Status: DC
  Administered 2016-01-18 – 2016-01-23 (×6): 25 mg via ORAL
  Filled 2016-01-17 (×8): qty 1
  Filled 2016-01-17: qty 14

## 2016-01-17 MED ORDER — DOXEPIN HCL 25 MG PO CAPS
25.0000 mg | ORAL_CAPSULE | Freq: Every day | ORAL | Status: DC
  Administered 2016-01-17: 25 mg via ORAL
  Filled 2016-01-17 (×2): qty 1

## 2016-01-17 MED ORDER — THIAMINE HCL 100 MG/ML IJ SOLN: 100.0000 mg | Freq: Every day | INTRAMUSCULAR | Status: DC

## 2016-01-17 MED ORDER — CARBAMAZEPINE ER 200 MG PO CP12
200.0000 mg | ORAL_CAPSULE | Freq: Two times a day (BID) | ORAL | Status: DC
  Administered 2016-01-17 (×2): 200 mg via ORAL
  Filled 2016-01-17 (×3): qty 1

## 2016-01-17 MED ORDER — VITAMIN B-1 100 MG PO TABS
100.0000 mg | ORAL_TABLET | Freq: Every day | ORAL | Status: DC
  Administered 2016-01-18: 100 mg via ORAL
  Filled 2016-01-17 (×2): qty 1

## 2016-01-17 MED ORDER — CHLORDIAZEPOXIDE HCL 25 MG PO CAPS
25.0000 mg | ORAL_CAPSULE | Freq: Four times a day (QID) | ORAL | Status: AC | PRN
  Administered 2016-01-17 – 2016-01-18 (×2): 25 mg via ORAL
  Filled 2016-01-17 (×4): qty 1

## 2016-01-17 MED ORDER — NICOTINE 21 MG/24HR TD PT24
21.0000 mg | MEDICATED_PATCH | Freq: Every day | TRANSDERMAL | Status: DC
  Administered 2016-01-17: 21 mg via TRANSDERMAL
  Filled 2016-01-17: qty 1

## 2016-01-17 MED ORDER — ADULT MULTIVITAMIN W/MINERALS CH
1.0000 | ORAL_TABLET | Freq: Every day | ORAL | Status: DC
  Administered 2016-01-17: 1 via ORAL
  Filled 2016-01-17: qty 1

## 2016-01-17 MED ORDER — DULOXETINE HCL 60 MG PO CPEP
60.0000 mg | ORAL_CAPSULE | Freq: Every day | ORAL | Status: DC
  Administered 2016-01-17: 60 mg via ORAL
  Filled 2016-01-17: qty 1

## 2016-01-17 MED ORDER — INFLUENZA VAC SPLIT QUAD 0.5 ML IM SUSY
0.5000 mL | PREFILLED_SYRINGE | INTRAMUSCULAR | Status: AC
  Administered 2016-01-23: 0.5 mL via INTRAMUSCULAR
  Filled 2016-01-17: qty 0.5

## 2016-01-17 NOTE — ED Notes (Signed)
Psychiatry at bedside.

## 2016-01-17 NOTE — Progress Notes (Signed)
Pt did not attend AA meeting this evening. Pt stayed in bed asleep.  

## 2016-01-17 NOTE — ED Notes (Signed)
Pt sleeping, mother states she was going home.

## 2016-01-17 NOTE — Tx Team (Signed)
Initial Interdisciplinary Treatment Plan   PATIENT STRESSORS: Marital or family conflict Substance abuse   PATIENT STRENGTHS: Ability for insight Average or above average intelligence   PROBLEM LIST: Problem List/Patient Goals Date to be addressed Date deferred Reason deferred Estimated date of resolution  "I want to quit drinking." 01/17/2016     "I want to get into treatment." 01/17/2016                                                DISCHARGE CRITERIA:  Improved stabilization in mood, thinking, and/or behavior  PRELIMINARY DISCHARGE PLAN: Attend 12-step recovery group Outpatient therapy  PATIENT/FAMIILY INVOLVEMENT: This treatment plan has been presented to and reviewed with the patient, Sarah Ellis, and/or family member.  The patient and family have been given the opportunity to ask questions and make suggestions.  [name], [name] 01/17/2016, 3:19 PM

## 2016-01-17 NOTE — Progress Notes (Signed)
Recreation Therapy Notes  Animal-Assisted Activity (AAA) Program Checklist/Progress Notes Patient Eligibility Criteria Checklist & Daily Group note for Rec Tx Intervention  Date: 03.07.2017 Time: 2:45pm [facility]   AAA/T Program Assumption of Risk Form signed by Patient/ or Parent Legal Guardian yes  Patient is free of allergies or sever asthma yes  Patient reports no fear of animals yes  Patient reports no history of cruelty to animals yes  Patient understands his/her participation is voluntary yes  Behavioral Response: Did not attend.    Marykay Lexenise L Quency Tober, LRT/CTRS        Jere Bostrom L 01/17/2016 3:04 PM

## 2016-01-17 NOTE — ED Notes (Signed)
TTS at bedside. 

## 2016-01-17 NOTE — ED Notes (Signed)
Bed: WA07 Expected date:  Expected time:  Means of arrival:  Comments: 

## 2016-01-17 NOTE — Progress Notes (Signed)
Pt [facility] care staff [facility] [place]
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
## 2016-01-17 NOTE — BH Assessment (Signed)
[facility] Assessment Progress Note  Per morning shift report, Sarah SievertSpencer Simon, PA has determined that this pt requires psychiatric hospitalization at this time.  Sarah Rippleoyka Perry, TS reports that Sarah Heinrichina Tate, RN, [facility] has [facility] pt to Rm 303-2.  Pt has signed Voluntary Admission and Consent for Treatment, as well as Consent to Release Information to no one, and signed forms have [facility].  Pt's nurse has been notified, and agrees to send original paperwork along with pt via [place], and to call report to [phone].  Sarah Canninghomas Liesel Peckenpaugh, MA Triage Specialist [phone]

## 2016-01-17 NOTE — Tx Team (Signed)
Initial Interdisciplinary Treatment Plan   PATIENT STRESSORS: Loss of relationship Medication change or noncompliance   PATIENT STRENGTHS: Ability for insight Average or above average intelligence Communication skills Motivation for treatment/growth   PROBLEM LIST: Problem List/Patient Goals Date to be addressed Date deferred Reason deferred Estimated date of resolution  "I want to get back on my medication." 01/17/2016     Suicidal ideation 01/17/2016     Depression 01/17/2016                                          DISCHARGE CRITERIA:  Improved stabilization in mood, thinking, and/or behavior  PRELIMINARY DISCHARGE PLAN: Attend 12-step recovery group  PATIENT/FAMIILY INVOLVEMENT: This treatment plan has been presented to and reviewed with the patient, Sarah Ellis, and/or family member.  The patient and family have been given the opportunity to ask questions and make suggestions.  [name], Sarah Ellis 01/17/2016, 5:30 PM

## 2016-01-18 ENCOUNTER — Encounter (HOSPITAL_COMMUNITY): Payer: Self-pay | Admitting: Psychiatry

## 2016-01-18 DIAGNOSIS — F331 Major depressive disorder, recurrent, moderate: Principal | ICD-10-CM

## 2016-01-18 DIAGNOSIS — F1520 Other stimulant dependence, uncomplicated: Secondary | ICD-10-CM | POA: Diagnosis present

## 2016-01-18 DIAGNOSIS — F1020 Alcohol dependence, uncomplicated: Secondary | ICD-10-CM

## 2016-01-18 DIAGNOSIS — F411 Generalized anxiety disorder: Secondary | ICD-10-CM

## 2016-01-18 LAB — PREGNANCY, URINE: PREG TEST UR: NEGATIVE

## 2016-01-18 MED ORDER — HYDROXYZINE HCL 25 MG PO TABS
25.0000 mg | ORAL_TABLET | Freq: Four times a day (QID) | ORAL | Status: DC | PRN
  Administered 2016-01-20 (×2): 25 mg via ORAL
  Filled 2016-01-18 (×2): qty 1

## 2016-01-18 MED ORDER — ONDANSETRON 4 MG PO TBDP
4.0000 mg | ORAL_TABLET | Freq: Four times a day (QID) | ORAL | Status: DC | PRN
Start: 2016-01-18 — End: 2016-01-18

## 2016-01-18 MED ORDER — CHLORDIAZEPOXIDE HCL 25 MG PO CAPS
25.0000 mg | ORAL_CAPSULE | Freq: Every day | ORAL | Status: AC
  Administered 2016-01-21: 25 mg via ORAL
  Filled 2016-01-18 (×2): qty 1

## 2016-01-18 MED ORDER — CHLORDIAZEPOXIDE HCL 25 MG PO CAPS
25.0000 mg | ORAL_CAPSULE | Freq: Four times a day (QID) | ORAL | Status: AC
  Administered 2016-01-18 – 2016-01-19 (×4): 25 mg via ORAL
  Filled 2016-01-18 (×3): qty 1

## 2016-01-18 MED ORDER — IBUPROFEN 600 MG PO TABS
600.0000 mg | ORAL_TABLET | Freq: Four times a day (QID) | ORAL | Status: DC | PRN
  Administered 2016-01-18: 600 mg via ORAL
  Filled 2016-01-18: qty 1

## 2016-01-18 MED ORDER — ADULT MULTIVITAMIN W/MINERALS CH: 1.0000 | ORAL_TABLET | Freq: Every day | ORAL | Status: DC

## 2016-01-18 MED ORDER — THIAMINE HCL 100 MG/ML IJ SOLN: 100.0000 mg | Freq: Once | INTRAMUSCULAR | Status: AC

## 2016-01-18 MED ORDER — CHLORDIAZEPOXIDE HCL 25 MG PO CAPS
25.0000 mg | ORAL_CAPSULE | Freq: Four times a day (QID) | ORAL | Status: DC | PRN
  Administered 2016-01-20: 25 mg via ORAL

## 2016-01-18 MED ORDER — CITALOPRAM HYDROBROMIDE 20 MG PO TABS
20.0000 mg | ORAL_TABLET | Freq: Every day | ORAL | Status: DC
  Administered 2016-01-18 – 2016-01-19 (×2): 20 mg via ORAL
  Filled 2016-01-18 (×4): qty 1

## 2016-01-18 MED ORDER — CARBAMAZEPINE 200 MG PO TABS
200.0000 mg | ORAL_TABLET | Freq: Two times a day (BID) | ORAL | Status: DC
  Administered 2016-01-18 – 2016-01-23 (×11): 200 mg via ORAL
  Filled 2016-01-18 (×4): qty 1
  Filled 2016-01-18: qty 28
  Filled 2016-01-18 (×7): qty 1
  Filled 2016-01-18: qty 28
  Filled 2016-01-18 (×3): qty 1

## 2016-01-18 MED ORDER — LOPERAMIDE HCL 2 MG PO CAPS: 2.0000 mg | ORAL_CAPSULE | ORAL | Status: DC | PRN

## 2016-01-18 MED ORDER — CHLORDIAZEPOXIDE HCL 25 MG PO CAPS
25.0000 mg | ORAL_CAPSULE | Freq: Three times a day (TID) | ORAL | Status: AC
  Administered 2016-01-19 (×2): 25 mg via ORAL
  Filled 2016-01-18 (×2): qty 1

## 2016-01-18 MED ORDER — CHLORDIAZEPOXIDE HCL 25 MG PO CAPS
25.0000 mg | ORAL_CAPSULE | ORAL | Status: AC
  Administered 2016-01-20 (×2): 25 mg via ORAL
  Filled 2016-01-18 (×2): qty 1

## 2016-01-18 MED ORDER — BUSPIRONE HCL 15 MG PO TABS
15.0000 mg | ORAL_TABLET | Freq: Three times a day (TID) | ORAL | Status: DC
  Administered 2016-01-18 – 2016-01-23 (×16): 15 mg via ORAL
  Filled 2016-01-18 (×3): qty 1
  Filled 2016-01-18: qty 42
  Filled 2016-01-18 (×5): qty 1
  Filled 2016-01-18: qty 3
  Filled 2016-01-18 (×7): qty 1
  Filled 2016-01-18 (×2): qty 42
  Filled 2016-01-18 (×2): qty 1

## 2016-01-18 MED ORDER — VITAMIN B-1 100 MG PO TABS
100.0000 mg | ORAL_TABLET | Freq: Every day | ORAL | Status: DC
  Administered 2016-01-19 – 2016-01-23 (×5): 100 mg via ORAL
  Filled 2016-01-18 (×7): qty 1

## 2016-01-18 NOTE — [facility] Group Notes (Signed)
[facility]   01/18/2016 10:37 AM  Participation Quality:  Invited. DID NOT ATTEND. Pt chose to remain in bed.   Smart, Jaydan Meidinger LCSW
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
## 2016-01-18 NOTE — Progress Notes (Signed)
Pt has been up this evening out in the milieu, but still c/o moderate withdrawal symptoms.  She has visible tremors, and reports moderate anxiety.  Pt denies SI/HI/AVH.  Pt states she has gone to some of the groups.  She makes her needs known to staff.  Support and encouragement offered.  Discharge plans are in process.  Safety maintained with q15 minute checks.
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
## 2016-01-18 NOTE — Progress Notes (Signed)
Patient anxious and reports "feeling terrible" today. Reports withdrawal symptoms - anxiety, tremors/shakiness. Rating her depression and anxiety both at a 10+/10, hopelessness at a 9/10. Affect is anxious, tense appearing with congruent mood. Patient started on librium protocol and CIWAs have improved with subsequent librium doses. VSS. Medicated per orders. Support and reassurance offered. Urine sample obtained and sent with lab. Patient indicates on her self inventory she would like a family session. Will encourage her to verbalize to SW. She denies SI/HI and remains safe on level III obs. Sarah MarseillesFriedman, Sarah Ellis
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
## 2016-01-18 NOTE — Progress Notes (Signed)
Pt has been in bed since the beginning of the shift.  Pt reports she is having significant withdrawal symptoms at this time and is feeling anxious about being here.  Pt contracts for safety.  She denies HI/AVH.  Pt requested and received medication for anxiety.  She was encouraged to make her needs known to staff.  Support and encouragement offered.  Discharge plans are in process.  Safety maintained with q15 minute checks.

## 2016-01-18 NOTE — Progress Notes (Signed)
Adult Psychoeducational Group Note  Date:  01/18/2016 Time:  11:43 PM  Group Topic/Focus:  Wrap-Up Group:   The focus of this group is to help patients review their daily goal of treatment and discuss progress on daily workbooks.  Participation Level:  Did Not Attend  Participation Quality:  Drowsy  Affect:  Irritable  Cognitive:  Confused  Insight: Limited  Engagement in Group:  Poor  Modes of Intervention:  Support  Additional Comments:  Pt decided not to go to group   Toy Samarin R 01/18/2016, 11:43 PM

## 2016-01-18 NOTE — Progress Notes (Signed)
Recreation Therapy Notes  Date: 03.08.2017 Time: 9:30am Location: 300 [address] Group Room  Group Topic: Stress Management  Goal Area(s) Addresses:  Patient will actively participate in stress management techniques presented during session.   Behavioral Response: Did not attend.   Edrik Rundle L Sai Moura, LRT/CTRS         Siris Hoos L 01/18/2016 10:12 AM 

## 2016-01-18 NOTE — [facility] Group Notes (Signed)
[facility] LCSW Group Therapy  01/18/2016 1:16 PM  Type of Therapy:  Group Therapy  Participation Level:  Did Not Attend-invited. Chose not attend   Modes of Intervention:  Confrontation, Discussion, Education, Exploration, Problem-solving, Rapport Building, Socialization and Support  Summary of Progress/Problems: Emotion Regulation: This group focused on both positive and negative emotion identification and allowed group members to process ways to identify feelings, regulate negative emotions, and find healthy ways to manage internal/external emotions. Group members were asked to reflect on a time when their reaction to an emotion led to a negative outcome and explored how alternative responses using emotion regulation would have benefited them. Group members were also asked to discuss a time when emotion regulation was utilized when a negative emotion was experienced.   Ellis, Sarah Alicea LCSW 01/18/2016, 1:16 PM

## 2016-01-19 MED ORDER — LITHIUM CARBONATE ER 300 MG PO TBCR
300.0000 mg | EXTENDED_RELEASE_TABLET | Freq: Two times a day (BID) | ORAL | Status: DC
  Administered 2016-01-19 – 2016-01-23 (×9): 300 mg via ORAL
  Filled 2016-01-19: qty 28
  Filled 2016-01-19 (×9): qty 1
  Filled 2016-01-19: qty 28
  Filled 2016-01-19 (×4): qty 1

## 2016-01-19 MED ORDER — DULOXETINE HCL 30 MG PO CPEP: 30.0000 mg | ORAL_CAPSULE | Freq: Every day | ORAL | Status: DC

## 2016-01-19 MED ORDER — METRONIDAZOLE 500 MG PO TABS
500.0000 mg | ORAL_TABLET | Freq: Three times a day (TID) | ORAL | Status: DC
  Administered 2016-01-19 – 2016-01-23 (×14): 500 mg via ORAL
  Filled 2016-01-19 (×7): qty 1
  Filled 2016-01-19: qty 2
  Filled 2016-01-19 (×7): qty 1
  Filled 2016-01-19: qty 10
  Filled 2016-01-19: qty 1
  Filled 2016-01-19 (×2): qty 10

## 2016-01-19 MED ORDER — DULOXETINE HCL 30 MG PO CPEP
30.0000 mg | ORAL_CAPSULE | Freq: Every day | ORAL | Status: DC
  Administered 2016-01-19 – 2016-01-20 (×2): 30 mg via ORAL
  Filled 2016-01-19 (×4): qty 1

## 2016-01-19 NOTE — Progress Notes (Signed)
CSW faxed [facility] referral and left message for [facility] in attempt to schedule screening and review pt medications. Per pt, she [facility] and [facility] by [facility].   Trula SladeHeather Smart, MSW, LCSW Clinical Social Worker 01/19/2016 3:11 PM

## 2016-01-19 NOTE — Progress Notes (Signed)
Psychoeducational Group Note  Date:  01/19/2016 Time:  2100 Group Topic/Focus:  wrap up group  Participation Level: Did Not Attend  Participation Quality:  Not Applicable  Affect:  Not Applicable  Cognitive:  Not Applicable  Insight:  Not Applicable  Engagement in Group: Not Applicable  Additional Comments:  Pt was notified that group was beginning but remained in her room reading.   Marcille BuffyMcNeil, Montario Zilka S 01/19/2016, 10:22 PM
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
## 2016-01-19 NOTE — Progress Notes (Signed)
D-  Patient denies SI, HI, and AVH but says that she is still feeling week from ETOH withdrawal.  Patient states that she is really trying to gain sobriety and she doesn't want to go through this again.   A- Assess for safety, offer medications as prescribed engage patient in 1:1 staff talks,   R-  Patient able to contract for safety.

## 2016-01-19 NOTE — Progress Notes (Signed)
D:Patient in bed on approach.  Patient states she had a good day.  Patient states she was started on new medications and wanted some reading material on them.  Patient states she wants to do better and get her life together.  Patient is pleasant and cooperative. Patient denies SI/HI and denies AVH. A: Staff to monitor Q 15 mins for safety.  Encouragement and support offered.  Scheduled medications administered per orders.   R: Patient remains safe on the unit.  Patient attended group tonight.  Patient visible on the unit for medications.  Patient taking administered medications.

## 2016-01-20 MED ORDER — HYDROXYZINE HCL 25 MG PO TABS: 25.0000 mg | ORAL_TABLET | Freq: Four times a day (QID) | ORAL | Status: DC | PRN

## 2016-01-20 MED ORDER — HYDROXYZINE HCL 25 MG PO TABS
25.0000 mg | ORAL_TABLET | Freq: Four times a day (QID) | ORAL | Status: DC | PRN
  Administered 2016-01-21 – 2016-01-23 (×5): 25 mg via ORAL
  Filled 2016-01-20 (×2): qty 1
  Filled 2016-01-20: qty 20
  Filled 2016-01-20 (×5): qty 1

## 2016-01-20 MED ORDER — MAGNESIUM CITRATE PO SOLN
1.0000 | Freq: Once | ORAL | Status: AC
  Administered 2016-01-20: 1 via ORAL

## 2016-01-20 MED ORDER — DOXEPIN HCL 50 MG PO CAPS
50.0000 mg | ORAL_CAPSULE | Freq: Every day | ORAL | Status: DC
  Administered 2016-01-20: 50 mg via ORAL
  Filled 2016-01-20 (×2): qty 1
  Filled 2016-01-20: qty 2
  Filled 2016-01-20: qty 1

## 2016-01-20 MED ORDER — MELOXICAM 7.5 MG PO TABS
7.5000 mg | ORAL_TABLET | Freq: Every day | ORAL | Status: DC
Start: 2016-01-20 — End: 2016-01-23
  Administered 2016-01-20 – 2016-01-23 (×4): 7.5 mg via ORAL
  Filled 2016-01-20 (×7): qty 1

## 2016-01-20 MED ORDER — CITALOPRAM HYDROBROMIDE 10 MG PO TABS
10.0000 mg | ORAL_TABLET | Freq: Every day | ORAL | Status: DC
  Administered 2016-01-20 – 2016-01-22 (×3): 10 mg via ORAL
  Filled 2016-01-20 (×5): qty 1

## 2016-01-20 MED ORDER — CHLORDIAZEPOXIDE HCL 25 MG PO CAPS
25.0000 mg | ORAL_CAPSULE | Freq: Four times a day (QID) | ORAL | Status: DC | PRN
  Administered 2016-01-21 – 2016-01-23 (×4): 25 mg via ORAL
  Filled 2016-01-20 (×4): qty 1

## 2016-01-20 NOTE — Progress Notes (Signed)
Recreation Therapy Notes  Date: 03.10.2017 Time: 9:30am Location: 300 [address] Group Room   Group Topic: Stress Management  Goal Area(s) Addresses:  Patient will actively participate in stress management techniques presented during session.   Behavioral Response: Did not attend.    Darlisha Kelm L Sharelle Burditt, LRT/CTRS        Aneudy Champlain L 01/20/2016 10:23 AM 

## 2016-01-20 NOTE — Progress Notes (Signed)
DAR NOTE: Pt present with flat affect and depressed mood in the unit. Pt has been isolating herself and has been bed most of the morning hours. Pt complained of stomach aches, took all her meds as scheduled. As per self inventory, pt had a por night sleep, fair appetite, low energy, and poor concentration. Pt rate depression at 5, hopeless ness at 5, and anxiety at 8. Pt goal for today is " getting into another place from here." Pt's safety ensured with 15 minute and environmental checks. Pt currently denies SI/HI and A/V hallucinations. Pt verbally agrees to seek staff if SI/HI or A/VH occurs and to consult with staff before acting on these thoughts. Will continue POC.

## 2016-01-20 NOTE — Plan of Care (Signed)
Problem: Alteration in mood & ability to function due to Goal: LTG-Pt reports reduction in suicidal thoughts (Patient reports reduction in suicidal thoughts and is able to verbalize a safety plan for whenever patient is feeling suicidal)  Outcome: Progressing Patient denies SI.  Patient verbally contracts for safety.     
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
## 2016-01-20 NOTE — [facility] Group Notes (Signed)
[facility] LCSW Aftercare Discharge Planning Group Note   01/20/2016 9:24 AM  Participation Quality:  Invited. DID NOT ATTEND. Pt chose to remain in bed.   Smart, Laylanie Kruczek LCSW   
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
## 2016-01-21 MED ORDER — DOXEPIN HCL 25 MG PO CAPS
75.0000 mg | ORAL_CAPSULE | Freq: Every day | ORAL | Status: DC
  Administered 2016-01-21 – 2016-01-23 (×3): 75 mg via ORAL
  Filled 2016-01-21: qty 1
  Filled 2016-01-21: qty 42
  Filled 2016-01-21: qty 3
  Filled 2016-01-21 (×3): qty 1

## 2016-01-21 NOTE — Progress Notes (Signed)
D-  Patient has been attending groups, engaged in unit activities. Patient stated that she has been feeling a little better but she is still having feelings of ETOH withdrawal.  Patient denies SI, HI and AVH .  A- Assess patient for safety, offer medications as prescribed, engage patient in 1:1 staff talks.   R-  Patient able to contract for safety.

## 2016-01-21 NOTE — Progress Notes (Signed)
D: Patient observed interacting with peers. Patient states her goal for the day was to " get some sleep and work on discharge place to go and continue getting help." A: patient is working with Child psychotherapistsocial worker for continued treatment. Support and encouragement provided. Q 15 minute checks in progress and maintained for safety. R: Patient remains safe on unit.

## 2016-01-21 NOTE — [facility] Group Notes (Signed)
[facility] Group Notes: (Clinical Social Work)   01/21/2016      Type of Therapy:  Group Therapy   Participation Level:  Did Not Attend despite MHT prompting   Wesson Stith Grossman-Orr, LCSW 01/21/2016, 12:44 PM     

## 2016-01-21 NOTE — Progress Notes (Signed)
D.  Pt pleasant on approach, no complaints voiced.  Pt observed in dayroom appropriately interacting with peers on the unit.  Pt on phone often during shift.  Pt denies SI/SI/hallucinations at this time.  A.  Support and encouragement offered, medications given as ordered.  R.  Pt remains safe on the unit, will continue to monitor.
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
## 2016-01-22 MED ORDER — CITALOPRAM HYDROBROMIDE 10 MG PO TABS
10.0000 mg | ORAL_TABLET | Freq: Once | ORAL | Status: AC
  Administered 2016-01-22: 10 mg via ORAL
  Filled 2016-01-22: qty 1

## 2016-01-22 MED ORDER — CITALOPRAM HYDROBROMIDE 20 MG PO TABS
20.0000 mg | ORAL_TABLET | Freq: Every day | ORAL | Status: DC
  Administered 2016-01-23: 20 mg via ORAL
  Filled 2016-01-22 (×2): qty 1
  Filled 2016-01-22: qty 14
  Filled 2016-01-22: qty 1
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
## 2016-01-22 NOTE — [facility] Group Notes (Signed)
[facility] Group Notes: (Clinical Social Work)   01/22/2016      Type of Therapy:  Group Therapy   Participation Level:  Did Not Attend despite MHT prompting   Khylen Riolo Grossman-Orr, LCSW 01/22/2016, 12:25 PM     

## 2016-01-22 NOTE — [facility] Group Notes (Signed)
[facility] Group Notes:  (Nursing/MHT/Case Management/Adjunct)  Date:  01/22/2016  Time:  10:35 AM  Type of Therapy:  Psychoeducational Skills  Participation Level:  Active  Participation Quality:  Appropriate, Attentive, Sharing and Supportive  Affect:  Appropriate  Cognitive:  Appropriate  Insight:  Improving  Engagement in Group:  Engaged and Supportive  Modes of Intervention:  Discussion and Education  Summary of Progress/Problems: Sarah Folksmanda attended group and was engaged. She read a poem called Today and Tomorrow. She received daily workbook. She reported that she would like a print off of her medications in order to educate herself more on them. She also reported she wants to speak to the physician about an increase in Celexa.   Sarah Ellis, Sarah Ellis 01/22/2016, 10:35 AM

## 2016-01-22 NOTE — Progress Notes (Signed)
Patient did attend the evening speaker AA meeting.  

## 2016-01-23 MED ORDER — ADULT MULTIVITAMIN W/MINERALS CH: 1.0000 | ORAL_TABLET | Freq: Every day | ORAL | Status: DC

## 2016-01-23 MED ORDER — CARBAMAZEPINE 200 MG PO TABS: 200.0000 mg | ORAL_TABLET | Freq: Two times a day (BID) | ORAL | Status: AC

## 2016-01-23 MED ORDER — CITALOPRAM HYDROBROMIDE 20 MG PO TABS: 20.0000 mg | ORAL_TABLET | Freq: Every day | ORAL | Status: AC

## 2016-01-23 MED ORDER — DOCUSATE SODIUM 100 MG PO CAPS
100.0000 mg | ORAL_CAPSULE | Freq: Two times a day (BID) | ORAL | Status: DC
  Administered 2016-01-23: 100 mg via ORAL
  Filled 2016-01-23 (×4): qty 1

## 2016-01-23 MED ORDER — MELOXICAM 15 MG PO TABS: 15.0000 mg | ORAL_TABLET | Freq: Every day | ORAL | Status: DC

## 2016-01-23 MED ORDER — BUSPIRONE HCL 15 MG PO TABS: 15.0000 mg | ORAL_TABLET | Freq: Three times a day (TID) | ORAL | Status: AC

## 2016-01-23 MED ORDER — NICOTINE 21 MG/24HR TD PT24: 21.0000 mg | MEDICATED_PATCH | Freq: Every day | TRANSDERMAL | Status: DC

## 2016-01-23 MED ORDER — MELOXICAM 7.5 MG PO TABS
15.0000 mg | ORAL_TABLET | Freq: Every day | ORAL | Status: DC
  Filled 2016-01-23 (×2): qty 2

## 2016-01-23 MED ORDER — LITHIUM CARBONATE ER 300 MG PO TBCR: 300.0000 mg | EXTENDED_RELEASE_TABLET | Freq: Two times a day (BID) | ORAL | Status: AC

## 2016-01-23 MED ORDER — DOCUSATE SODIUM 100 MG PO CAPS: 100.0000 mg | ORAL_CAPSULE | Freq: Two times a day (BID) | ORAL | Status: AC

## 2016-01-23 MED ORDER — MAGNESIUM CITRATE PO SOLN
1.0000 | Freq: Once | ORAL | Status: AC
  Administered 2016-01-23: 1 via ORAL

## 2016-01-23 MED ORDER — HYDROXYZINE HCL 25 MG PO TABS: 25.0000 mg | ORAL_TABLET | Freq: Four times a day (QID) | ORAL | Status: AC | PRN

## 2016-01-23 MED ORDER — NALTREXONE HCL 50 MG PO TABS: 25.0000 mg | ORAL_TABLET | Freq: Every day | ORAL | Status: DC

## 2016-01-23 MED ORDER — DOXEPIN HCL 75 MG PO CAPS: 75.0000 mg | ORAL_CAPSULE | Freq: Every day | ORAL | Status: AC

## 2016-01-23 MED ORDER — METRONIDAZOLE 500 MG PO TABS: 500.0000 mg | ORAL_TABLET | Freq: Three times a day (TID) | ORAL | Status: DC
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
## 2016-01-23 NOTE — Progress Notes (Signed)
Recreation Therapy Notes  Date: 03.13.2017 Time: 9:30am  Location: [address] Group Room   Group Topic: Stress Management  Goal Area(s) Addresses:  Patient will actively participate in stress management techniques presented during session.   Behavioral Response: Did not attend.   Marykay Lexenise L Ceclia Koker, LRT/CTRS        Aaisha Sliter L 01/23/2016 1:42 PM
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
## 2016-01-23 NOTE — Progress Notes (Signed)
Patient ID: Sarah Ellis, female   DOB: 02/01/1981, 35 y.o.   MRN: [id number]   Pt currently presents with a flataffect and anxious behavior. Per self inventory, pt rates depression at a 2, hopelessness 2 and anxiety 4. Pt's daily goal is to "getting ready to leave [facility]" and they intend to do so by "[facility] to [facility]." Pt reports good sleep, a fair appetite, normal energy and good concentration.   Pt provided with medications per providers orders. Pt's labs and vitals were monitored throughout the day. Pt supported emotionally and encouraged to express concerns and questions. Pt educated on medications. Pt given pneumonia and influenza vaccines, tolerated well.   Pt's safety ensured with 15 minute and environmental checks. Pt currently denies SI/HI and A/V hallucinations. Pt verbally agrees to seek staff if SI/HI or A/VH occurs and to consult with staff before acting on these thoughts. Pt to be discharged tomorrow morning to [facility] mother. Will continue POC.

## 2016-01-23 NOTE — BHH Suicide Risk Assessment (Signed)
[facility]   Principal Problem: Major depressive disorder, recurrent episode, moderate with anxious distress [facility](HCC) Discharge Diagnoses:  Patient Active Problem List   Diagnosis Date Noted  . 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 GAD (generalized anxiety disorder) [F41.1] 12/26/2015  . Major depressive disorder, recurrent episode, moderate with anxious distress (HCC) [F33.1] 12/23/2015  . Alcohol use disorder, severe, dependence (HCC) [F10.20] 12/23/2015    Total Time spent with patient: 20 minutes  Musculoskeletal: Strength & Muscle Tone: within normal limits Gait & Station: normal Patient leans: normal  Psychiatric Specialty Exam: Review of Systems  Constitutional: Negative.   HENT: Negative.   Eyes: Negative.   Respiratory: Negative.   Cardiovascular: Negative.   Gastrointestinal: Negative.   Genitourinary: Negative.   Musculoskeletal: Negative.   Skin: Negative.   Neurological: Negative.   Endo/Heme/Allergies: Negative.   Psychiatric/Behavioral: Positive for suicidal ideas. The patient is nervous/anxious.     Blood pressure 107/75, pulse 85, temperature 98.7 F (37.1 C), temperature source Oral, resp. rate 16, height 5\' 5"  (1.651 m), weight 60.328 kg (133 lb), last menstrual period 01/08/2016, SpO2 100 %.Body mass index is 22.13 kg/(m^2).  General Appearance: Fairly Groomed  Patent attorneyye Contact::  Fair  Speech:  Clear and Coherent409  Volume:  Normal  Mood:  Euthymic  Affect:  Appropriate  Thought Process:  Coherent and Goal Directed  Orientation:  Full (Time, Place, and Person)  Thought Content:  Plans as she moves on, relapse prevention plan  Suicidal Thoughts:  Denies  Homicidal Thoughts:  No  Memory:  Immediate;   Fair Recent;   Fair Remote;   Fair  Judgement:  Fair  Insight:  Present  Psychomotor Activity:  Normal  Concentration:  Fair  Recall:  FiservFair  Fund of Knowledge:Fair  Language: Fair  Akathisia:  No   Handed:  Right  AIMS (if indicated):     Assets:  Desire for Improvement  Sleep:  Number of Hours: 5.5  Cognition: WNL  ADL's:  Intact  In full contact with reality. There are no active S/S of withdrawal. There are no active SI plans or intent. She is looking forward to pursue residential treatment Mental Status Per Nursing Assessment::   On Admission:     Demographic Factors:  Caucasian  Loss Factors: None identified  Historical Factors: none identified  Risk Reduction Factors:   Sense of responsibility to family  Continued Clinical Symptoms:  Depression:   Comorbid alcohol abuse/dependence Impulsivity Alcohol/Substance Abuse/Dependencies  Cognitive Features That Contribute To Risk:  None    Suicide Risk:  Minimal: No identifiable suicidal ideation.  Patients presenting with no risk factors but with morbid ruminations; may be classified as minimal risk based on the severity of the depressive symptoms  Follow-up Information    [facility] On [facility]/24/2016.   Why:  Screening for possible admission on this date at 8:00AM. Please bring medications and photo ID/proof of [place] residence.    Contact information:   [address] [place], [place] [address] Phone: [phone] Fax: [phone]      [facility].   Why:  Walk in between 8am-9am Monday through Friday for hospital follow-up/medication management/assessment for counseling services.    Contact information:   [address]  [place], [place] [address] Phone: [phone] Fax: [phone]      Follow up with [facility].   Why:  Referral sent: 01/19/16. Phone Screening completed: 01/23/16. If you are still interested in this facility,  please contact Shayla in admissions at [phone].   Contact information:   [address] [place], [place] [address] Phone: [phone] Fax: [phone]      Plan Of Care/Follow-up recommendations:  Activity:  as tolerated Diet:   regular Follow up [facility] Rachael Fee, MD 01/23/2016, 5:37 PM

## 2016-01-23 NOTE — BHH Group Notes (Signed)
Adult Psychoeducational Group Note  Date:  01/23/2016 Time:  9:24 PM  Group Topic/Focus:  Wrap-Up Group:   The focus of this group is to help patients review their daily goal of treatment and discuss progress on daily workbooks.  Participation Level:  Active  Participation Quality:  Appropriate  Affect:  Appropriate  Cognitive:  Appropriate  Insight: Appropriate  Engagement in Group:  Engaged  Modes of Intervention:  Discussion  Additional Comments:  Pt stated the first part of her day was a -3 but after she took a nap it went up to an 8.  Her goal was to [facility] the [facility]  She is discharging tomorrow morning.  Caroll RancherLindsay, Orval Dortch A 01/23/2016, 9:24 PM

## 2016-01-23 NOTE — BHH Suicide Risk Assessment (Signed)
BHH INPATIENT:  Family/Significant Other Suicide Prevention Education  Suicide Prevention Education:  Education Completed; Gaylene BrooksDebra Brewster (pt's mother) [phone] has been identified by the patient as the family member/significant other with whom the patient will be residing, and identified as the person(s) who will aid the patient in the event of a mental health crisis (suicidal ideations/suicide attempt).  With written consent from the patient, the family member/significant other has been provided the following suicide prevention education, prior to the and/or following the discharge of the patient.  The suicide prevention education provided includes the following:  Suicide risk factors  Suicide prevention and interventions  National Suicide Hotline telephone number  [facility] assessment telephone number  [facility] Emergency Assistance 911  [facility] and/or Residential Mobile Crisis Unit telephone number  Request made of family/significant other to:  Remove weapons (e.g., guns, rifles, knives), all items previously/currently identified as safety concern.    Remove drugs/medications (over-the-counter, prescriptions, illicit drugs), all items previously/currently identified as a safety concern.  The family member/significant other verbalizes understanding of the suicide prevention education information provided.  The family member/significant other agrees to remove the items of safety concern listed above.  Smart, Saprina Chuong LCSW 01/23/2016, 10:21 AM

## 2016-01-23 NOTE — Progress Notes (Signed)
  [facility] :  Will you be returning to the same living situation after discharge:  No.Pt has [facility] screening for admission on Tuesday 01/24/16 At discharge, do you have transportation home?: Yes,  pt's mother will pick her up at 5am on Tuesday Do you have the ability to pay for your medications: Yes,  mental health  Release of information consent forms completed and submitted to medical records by CSW.  Patient to Follow up at: Follow-up Information    [facility].   Why:  Screening for possible admission on this date at 8:00AM. Please bring medications and photo ID/proof of [place] residence.    Contact information:   [address] [place], [place] [address] Phone: [phone] Fax: [phone]      [facility].   Why:  Walk in between 8am-9am Monday through Friday for hospital follow-up/medication management/assessment for counseling services.    Contact information:   [address]  , [place] [address] Phone: [phone] Fax: [phone]      Follow up with [facility].   Why:  Referral sent: 01/19/16. Phone Screening completed: 01/23/16. If you are still interested in this facility, please contact Shayla in admissions at [phone].   Contact information:   [address] [place], [place] [address] Phone: [phone] Fax: [phone]      Next level of care provider has access to [facility] Link:no  Safety Planning and Suicide Prevention discussed: Yes,  SPE completed with pt and her mother (during phone conference). Pt provided with SPI pamphlet, mobile crisis information and was encouraged to share this with her support network.   Have you used any form of tobacco in the last 30 days? (Cigarettes, Smokeless Tobacco, Cigars, and/or Pipes): Yes  Has patient been referred to the Quitline?: Patient refused referral  Patient has been referred for addiction treatment: Yes-see  above.   Smart, Jomel Whittlesey LCSW 01/23/2016, 10:22 AM

## 2016-01-23 NOTE — BHH Group Notes (Signed)
BHH LCSW Group Therapy  01/23/2016 3:09 PM  Type of Therapy:  Group Therapy  Participation Level:  Did Not Attend-pt chose to remain in room to rest.   Modes of Intervention:  Confrontation, Discussion, Education, Exploration, Problem-solving, Rapport Building, Socialization and Support  Summary of Progress/Problems: Today's Topic: Overcoming Obstacles. Patients identified one short term goal and potential obstacles in reaching this goal. Patients processed barriers involved in overcoming these obstacles. Patients identified steps necessary for overcoming these obstacles and explored motivation (internal and external) for facing these difficulties head on.   Smart, Sofhia Ulibarri LCSW 01/23/2016, 3:09 PM

## 2016-01-23 NOTE — Progress Notes (Signed)
D: Patient in the dayroom on approach.  Patient states she is supposed to go to be discharged at 5 am tomorrow morning.  [facility]  Patient states she wants to be a better person so she can get her children back.  Patient states she is anxious about going [facility] but knows she has to do it.  Patient states no one in her family trust her due to her substance history.  Patient states she hopes to gain their trust back.  Patient denies SI/HI and denies AVH. A: Staff to monitor Q 15 mins for safety.  Encouragement and support offered.  Scheduled medications administered per orders.  Librium administered prn for anxiety.   R: Patient remains safe on the unit.  Patient attended group tonight.  Patient visible on the unit and interacting with peers.  Patient taking administered medications.

## 2016-01-23 NOTE — BHH Group Notes (Signed)
[facility]   01/23/2016 10:20 AM  Participation Quality:  Pt had phone interview with [facility] during group. EXCUSED.   Smart, Marguriete Wootan LCSW
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
## 2016-01-23 NOTE — Plan of Care (Signed)
Problem: Alteration in mood & ability to function due to Goal: STG-Patient will attend groups Outcome: Progressing Pt has attended evening AA groups this weekend

## 2016-01-24 NOTE — Progress Notes (Signed)
Discharge Note: [facility].  Patient released and her [facility]  RX, medications, AVS, BH transition record and Suicide Risk Assessment gone over and given to patient.  Patient has no further questions.  Patient denies SI/HI and denies AVH.  Belongings retrieves to patient from locker #55.  Patient escorted to the car at 0453.

## 2016-04-17 ENCOUNTER — Encounter (HOSPITAL_BASED_OUTPATIENT_CLINIC_OR_DEPARTMENT_OTHER): Payer: Self-pay | Admitting: Emergency Medicine

## 2016-04-17 ENCOUNTER — Emergency Department (HOSPITAL_BASED_OUTPATIENT_CLINIC_OR_DEPARTMENT_OTHER)
Admission: EM | Admit: 2016-04-17 | Discharge: 2016-04-17 | Disposition: A | Discharge status: Home / Self Care | Payer: Self-pay | Attending: Emergency Medicine | Admitting: Emergency Medicine

## 2016-04-17 DIAGNOSIS — F329 Major depressive disorder, single episode, unspecified: Secondary | ICD-10-CM | POA: Insufficient documentation

## 2016-04-17 DIAGNOSIS — F17210 Nicotine dependence, cigarettes, uncomplicated: Secondary | ICD-10-CM | POA: Insufficient documentation

## 2016-04-17 DIAGNOSIS — T7840XA Allergy, unspecified, initial encounter: Secondary | ICD-10-CM | POA: Insufficient documentation

## 2016-04-17 HISTORY — DX: Alcohol abuse, uncomplicated: F10.10

## 2016-04-17 HISTORY — DX: Cocaine abuse, uncomplicated: F14.10

## 2016-04-17 MED ORDER — PREDNISONE 20 MG PO TABS: 40.0000 mg | ORAL_TABLET | Freq: Every day | ORAL | Status: DC

## 2016-04-17 MED ORDER — LORATADINE 10 MG PO TABS
10.0000 mg | ORAL_TABLET | Freq: Once | ORAL | Status: AC
  Administered 2016-04-17: 10 mg via ORAL
  Filled 2016-04-17: qty 1

## 2016-04-17 MED ORDER — RANITIDINE HCL 150 MG/10ML PO SYRP
150.0000 mg | ORAL_SOLUTION | Freq: Once | ORAL | Status: AC
  Administered 2016-04-17: 150 mg via ORAL
  Filled 2016-04-17: qty 10

## 2016-04-17 MED ORDER — LORATADINE 10 MG PO TABS: 10.0000 mg | ORAL_TABLET | Freq: Every day | ORAL | Status: DC

## 2016-04-17 MED ORDER — PREDNISONE 20 MG PO TABS
40.0000 mg | ORAL_TABLET | Freq: Once | ORAL | Status: AC
  Administered 2016-04-17: 40 mg via ORAL
  Filled 2016-04-17: qty 2
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
## 2016-04-17 NOTE — ED Notes (Signed)
Patient states that she feels like she has an allergy to almonds. Her lips / face are burning and she feels like her lips are swollen. The patient denies any trouble breathing and in no distress at nurse first desk.  Patient also reports that she is sneezing and has runny nose

## 2016-09-13 ENCOUNTER — Emergency Department (HOSPITAL_COMMUNITY)
Admission: EM | Admit: 2016-09-13 | Discharge: 2016-09-13 | Discharge status: Against Medical Advice | Payer: Self-pay | Attending: Emergency Medicine | Admitting: Emergency Medicine

## 2016-09-13 ENCOUNTER — Encounter (HOSPITAL_COMMUNITY): Payer: Self-pay | Admitting: Emergency Medicine

## 2016-09-13 DIAGNOSIS — F17210 Nicotine dependence, cigarettes, uncomplicated: Secondary | ICD-10-CM | POA: Insufficient documentation

## 2016-09-13 DIAGNOSIS — R079 Chest pain, unspecified: Secondary | ICD-10-CM

## 2016-09-13 DIAGNOSIS — R0789 Other chest pain: Secondary | ICD-10-CM | POA: Insufficient documentation

## 2016-09-13 LAB — COMPREHENSIVE METABOLIC PANEL
ALBUMIN: 4 g/dL (ref 3.5–5.0)
ALK PHOS: 87 U/L (ref 38–126)
ALT: 18 U/L (ref 14–54)
ANION GAP: 11 (ref 5–15)
AST: 19 U/L (ref 15–41)
BILIRUBIN TOTAL: 0.5 mg/dL (ref 0.3–1.2)
BUN: 6 mg/dL (ref 6–20)
CALCIUM: 8.7 mg/dL — AB (ref 8.9–10.3)
CO2: 22 mmol/L (ref 22–32)
CREATININE: 0.62 mg/dL (ref 0.44–1.00)
Chloride: 110 mmol/L (ref 101–111)
GFR calc Af Amer: >60 mL/min (ref >60)
GFR calc non Af Amer: >60 mL/min (ref >60)
GLUCOSE: 105 mg/dL — AB (ref 65–99)
Potassium: 4 mmol/L (ref 3.5–5.1)
Sodium: 143 mmol/L (ref 135–145)
TOTAL PROTEIN: 6.5 g/dL (ref 6.5–8.1)

## 2016-09-13 LAB — CBC WITH DIFFERENTIAL/PLATELET
BASOS PCT: 1 %
Basophils Absolute: 0 10*3/uL (ref 0.0–0.1)
Eosinophils Absolute: 0.2 10*3/uL (ref 0.0–0.7)
Eosinophils Relative: 3 %
HEMATOCRIT: 38.6 % (ref 36.0–46.0)
HEMOGLOBIN: 12.9 g/dL (ref 12.0–15.0)
LYMPHS ABS: 2.3 10*3/uL (ref 0.7–4.0)
Lymphocytes Relative: 33 %
MCH: 30.3 pg (ref 26.0–34.0)
MCHC: 33.4 g/dL (ref 30.0–36.0)
MCV: 90.6 fL (ref 78.0–100.0)
MONOS PCT: 5 %
Monocytes Absolute: 0.4 10*3/uL (ref 0.1–1.0)
NEUTROS ABS: 4.2 10*3/uL (ref 1.7–7.7)
NEUTROS PCT: 58 %
Platelets: 258 10*3/uL (ref 150–400)
RBC: 4.26 MIL/uL (ref 3.87–5.11)
RDW: 12.8 % (ref 11.5–15.5)
WBC: 7.1 10*3/uL (ref 4.0–10.5)

## 2016-09-13 LAB — I-STAT TROPONIN, ED: Troponin i, poc: 0.01 ng/mL (ref 0.00–0.08)

## 2016-09-13 MED ORDER — KETOROLAC TROMETHAMINE 30 MG/ML IJ SOLN: 30.0000 mg | Freq: Once | INTRAMUSCULAR | Status: DC

## 2016-09-13 MED ORDER — IBUPROFEN 800 MG PO TABS
800.0000 mg | ORAL_TABLET | Freq: Once | ORAL | Status: DC
  Filled 2016-09-13: qty 1
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
## 2016-09-13 NOTE — ED Notes (Signed)
EDP at bedside  

## 2016-09-13 NOTE — ED Notes (Signed)
Pt refused ibuprofen and stated she could "get ibuprofen from my purse, with the amount of pain I'm in I want something stronger." Pt proceeded to say she would "rather go to another ER and is leaving". Advised pt she would have to leave AMA. Pt reports "she is not signing anything" and began to take her leads off and get dressed. EDP aware.

## 2016-09-13 NOTE — ED Triage Notes (Signed)
Pt st's she has had a productive cough for approx 1 1/2 weeks.  St's today she started to have pain in upper right back and feel short of breath.  Pt speaking in full sentences

## 2016-09-13 NOTE — ED Notes (Signed)
Unsuccessful IV attempt. Pt states she would rather take oral medication and refuses another IV attempt.

## 2016-09-18 ENCOUNTER — Emergency Department (HOSPITAL_COMMUNITY): Payer: Self-pay

## 2016-09-18 ENCOUNTER — Emergency Department (HOSPITAL_COMMUNITY)
Admission: EM | Admit: 2016-09-18 | Discharge: 2016-09-18 | Disposition: A | Discharge status: Home / Self Care | Payer: Self-pay | Attending: Emergency Medicine | Admitting: Emergency Medicine

## 2016-09-18 ENCOUNTER — Encounter (HOSPITAL_COMMUNITY): Payer: Self-pay | Admitting: Emergency Medicine

## 2016-09-18 DIAGNOSIS — F1490 Cocaine use, unspecified, uncomplicated: Secondary | ICD-10-CM | POA: Insufficient documentation

## 2016-09-18 DIAGNOSIS — F17210 Nicotine dependence, cigarettes, uncomplicated: Secondary | ICD-10-CM | POA: Insufficient documentation

## 2016-09-18 DIAGNOSIS — F1010 Alcohol abuse, uncomplicated: Secondary | ICD-10-CM | POA: Insufficient documentation

## 2016-09-18 DIAGNOSIS — Z79899 Other long term (current) drug therapy: Secondary | ICD-10-CM | POA: Insufficient documentation

## 2016-09-18 DIAGNOSIS — R0789 Other chest pain: Secondary | ICD-10-CM | POA: Insufficient documentation

## 2016-09-18 LAB — CBC
HCT: 41.2 % (ref 36.0–46.0)
HEMOGLOBIN: 13.9 g/dL (ref 12.0–15.0)
MCH: 31.2 pg (ref 26.0–34.0)
MCHC: 33.7 g/dL (ref 30.0–36.0)
MCV: 92.6 fL (ref 78.0–100.0)
Platelets: 265 10*3/uL (ref 150–400)
RBC: 4.45 MIL/uL (ref 3.87–5.11)
RDW: 13.2 % (ref 11.5–15.5)
WBC: 7 10*3/uL (ref 4.0–10.5)

## 2016-09-18 LAB — RAPID URINE DRUG SCREEN, HOSP PERFORMED
AMPHETAMINES: NOT DETECTED
BARBITURATES: NOT DETECTED
Benzodiazepines: NOT DETECTED
Cocaine: POSITIVE — AB
Opiates: NOT DETECTED
TETRAHYDROCANNABINOL: NOT DETECTED

## 2016-09-18 LAB — COMPREHENSIVE METABOLIC PANEL
ALBUMIN: 4.3 g/dL (ref 3.5–5.0)
ALK PHOS: 102 U/L (ref 38–126)
ALT: 10 U/L — ABNORMAL LOW (ref 14–54)
ANION GAP: 12 (ref 5–15)
AST: 40 U/L (ref 15–41)
BILIRUBIN TOTAL: 1.2 mg/dL (ref 0.3–1.2)
BUN: 16 mg/dL (ref 6–20)
CO2: 20 mmol/L — AB (ref 22–32)
Calcium: 8.6 mg/dL — ABNORMAL LOW (ref 8.9–10.3)
Chloride: 107 mmol/L (ref 101–111)
Creatinine, Ser: 0.76 mg/dL (ref 0.44–1.00)
GFR calc Af Amer: >60 mL/min (ref >60)
GFR calc non Af Amer: >60 mL/min (ref >60)
GLUCOSE: 89 mg/dL (ref 65–99)
POTASSIUM: 5.2 mmol/L — AB (ref 3.5–5.1)
SODIUM: 139 mmol/L (ref 135–145)
TOTAL PROTEIN: 7 g/dL (ref 6.5–8.1)

## 2016-09-18 LAB — LIPASE, BLOOD: LIPASE: 31 U/L (ref 11–51)

## 2016-09-18 LAB — ETHANOL: Alcohol, Ethyl (B): 273 mg/dL — ABNORMAL HIGH (ref <5)

## 2016-09-18 LAB — I-STAT BETA HCG BLOOD, ED (MC, WL, AP ONLY)

## 2016-09-18 MED ORDER — SODIUM CHLORIDE 0.9 % IV BOLUS (SEPSIS)
1000.0000 mL | Freq: Once | INTRAVENOUS | Status: AC
  Administered 2016-09-18: 1000 mL via INTRAVENOUS

## 2016-09-18 MED ORDER — CHLORDIAZEPOXIDE HCL 25 MG PO CAPS: 50.0000 mg | ORAL_CAPSULE | Freq: Three times a day (TID) | ORAL | 0 refills | Status: DC | PRN

## 2016-09-18 MED ORDER — ONDANSETRON 4 MG PO TBDP: 4.0000 mg | ORAL_TABLET | Freq: Three times a day (TID) | ORAL | 0 refills | Status: DC | PRN

## 2016-09-18 MED ORDER — METHOCARBAMOL 500 MG PO TABS
500.0000 mg | ORAL_TABLET | Freq: Once | ORAL | Status: AC
  Administered 2016-09-18: 500 mg via ORAL
  Filled 2016-09-18: qty 1

## 2016-09-18 MED ORDER — KETOROLAC TROMETHAMINE 30 MG/ML IJ SOLN
30.0000 mg | Freq: Once | INTRAMUSCULAR | Status: AC
  Administered 2016-09-18: 30 mg via INTRAVENOUS
  Filled 2016-09-18: qty 1

## 2016-09-18 MED ORDER — ONDANSETRON HCL 4 MG/2ML IJ SOLN
4.0000 mg | Freq: Once | INTRAMUSCULAR | Status: AC
  Administered 2016-09-18: 4 mg via INTRAVENOUS
  Filled 2016-09-18: qty 2

## 2016-09-18 MED ORDER — HYDROXYZINE HCL 10 MG PO TABS
10.0000 mg | ORAL_TABLET | Freq: Once | ORAL | Status: AC
  Administered 2016-09-18: 10 mg via ORAL
  Filled 2016-09-18: qty 1

## 2016-09-18 NOTE — ED Notes (Signed)
Pt transported to xray 

## 2016-09-18 NOTE — ED Triage Notes (Signed)
Pt states she was seen 5 days ago for pain on the right side of her abdomen which has gotten worse.  States she wants to be seen for detox and her last drink was 2 hours ago.  Reports tremors, headache, diaphoresis.  Denies auditory or visual hallucinations.  States she normally takes bipolar medications but hasn't been compliant for the past few days.

## 2016-09-18 NOTE — ED Notes (Signed)
PA at bedside.
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
## 2016-09-18 NOTE — ED Notes (Signed)
RN to draw labs with start of IV 

## 2016-09-18 NOTE — ED Provider Notes (Signed)
Assumed care from PA Joy at shift change.  See his note for full H&P.  Briefly, 35 y.o. F here for alcohol detox.  Complained of shakiness, nausea, and sweating.  States last alcohol was 3 hours ago.  Daily intake 12+ beers.  Also right sided chest wall pain.  Plan: IVF, labs, x-ray ribs.  Family member here.  Likely can d/c with OP resources.  Results for orders placed or performed during the hospital encounter of 09/18/16  Comprehensive metabolic panel  Result Value Ref Range   Sodium 139 135 - 145 mmol/L   Potassium 5.2 (H) 3.5 - 5.1 mmol/L   Chloride 107 101 - 111 mmol/L   CO2 20 (L) 22 - 32 mmol/L   Glucose, Bld 89 65 - 99 mg/dL   BUN 16 6 - 20 mg/dL   Creatinine, Ser 9.560.76 0.44 - 1.00 mg/dL   Calcium 8.6 (L) 8.9 - 10.3 mg/dL   Total Protein 7.0 6.5 - 8.1 g/dL   Albumin 4.3 3.5 - 5.0 g/dL   AST 40 15 - 41 U/L   ALT 10 (L) 14 - 54 U/L   Alkaline Phosphatase 102 38 - 126 U/L   Total Bilirubin 1.2 0.3 - 1.2 mg/dL   GFR calc non Af Amer >60 >60 mL/min   GFR calc Af Amer >60 >60 mL/min   Anion gap 12 5 - 15  Ethanol  Result Value Ref Range   Alcohol, Ethyl (B) 273 (H) <5 mg/dL  cbc  Result Value Ref Range   WBC 7.0 4.0 - 10.5 K/uL   RBC 4.45 3.87 - 5.11 MIL/uL   Hemoglobin 13.9 12.0 - 15.0 g/dL   HCT 21.341.2 08.636.0 - 57.846.0 %   MCV 92.6 78.0 - 100.0 fL   MCH 31.2 26.0 - 34.0 pg   MCHC 33.7 30.0 - 36.0 g/dL   RDW 46.913.2 62.911.5 - 52.815.5 %   Platelets 265 150 - 400 K/uL  Rapid urine drug screen (hospital performed)  Result Value Ref Range   Opiates NONE DETECTED NONE DETECTED   Cocaine POSITIVE (A) NONE DETECTED   Benzodiazepines NONE DETECTED NONE DETECTED   Amphetamines NONE DETECTED NONE DETECTED   Tetrahydrocannabinol NONE DETECTED NONE DETECTED   Barbiturates NONE DETECTED NONE DETECTED  Lipase, blood  Result Value Ref Range   Lipase 31 11 - 51 U/L  I-Stat beta hCG blood, ED  Result Value Ref Range   I-stat hCG, quantitative <5.0 <5 mIU/mL   Comment 3           Dg Ribs  Unilateral W/chest Right  Result Date: 09/18/2016 CLINICAL DATA:  Cough for 1 week. Right chest wall and rib pain for 1 week. Current smoker. EXAM: RIGHT RIBS AND CHEST - 3+ VIEW COMPARISON:  None. FINDINGS: Normal heart size and pulmonary vascularity. No focal airspace disease or consolidation in the lungs. No blunting of costophrenic angles. No pneumothorax. Mediastinal contours appear intact. Right ribs appear intact. No acute displaced fractures or focal bone lesions identified. IMPRESSION: No evidence of active pulmonary disease.  Negative right ribs. Electronically Signed   By: Burman NievesWilliam  Stevens M.D.   On: 09/18/2016 21:22    9:54 PM Labs reassuring, ethanol 273.  Patient has been eating and drinking without difficulty or emesis.  On repeat exam, still complaining of pain in right ribs.  X-ray negative for acute findings. Pain is reproducible on exam.  no cardiac hx.  She is not tremulous or having any acute seizure activity.  Does not  appear to be in acute withdrawal at this time.  Denies SI/HI/AVH.  Does not appear to be a danger to herself.  Has responsible significant other here who will be driving her home.  Will d/c home with librium taper, outpatient resources.  Discussed plan with patient, she acknowledged understanding and agreed with plan of care.  Return precautions given for new or worsening symptoms.   Garlon HatchetLisa M Lurlean Kernen, PA-C 09/18/16 2357    Pricilla LovelessScott Goldston, MD 09/19/16 [phone]

## 2016-09-18 NOTE — ED Notes (Signed)
Pt stated that she felt she was having shakes. Pt encouraged to take prescriptions.  Pt ambulated out of department with fiance` with no reaction to medication at time of discharge.

## 2016-09-18 NOTE — ED Notes (Signed)
Pt tolerating PO fluids without emesis

## 2016-09-18 NOTE — Discharge Instructions (Signed)
Take the prescribed medication as directed-- this helps reduce withdrawal symptoms and seizure prevention. Abstain from alcohol and other illicit substances. Follow-up with one of the outpatient centers for formal detox. Return to the ED for new or worsening symptoms.

## 2016-09-19 ENCOUNTER — Telehealth: Payer: Self-pay | Admitting: *Deleted

## 2016-09-19 NOTE — Telephone Encounter (Signed)
Pharmacy called related to Rx: chlordiazePOXIDE (LIBRIUM) 25 MG capsule have two sets of directions .Marland Kitchen.Marland Kitchen.EDCM reviewed chart to fin that EDP "Will d/c home with librium taper."  Advised Pharm D to utilized taper dosing.

## 2016-09-21 ENCOUNTER — Inpatient Hospital Stay (HOSPITAL_COMMUNITY)
Admission: EM | Admit: 2016-09-21 | Discharge: 2016-09-26 | DRG: 917 | Disposition: A | Discharge status: Home / Self Care | Payer: Self-pay | Attending: Family Medicine | Admitting: Family Medicine

## 2016-09-21 ENCOUNTER — Inpatient Hospital Stay (HOSPITAL_COMMUNITY): Payer: Self-pay

## 2016-09-21 ENCOUNTER — Encounter (HOSPITAL_COMMUNITY): Payer: Self-pay

## 2016-09-21 DIAGNOSIS — T56892A Toxic effect of other metals, intentional self-harm, initial encounter: Secondary | ICD-10-CM

## 2016-09-21 DIAGNOSIS — F10129 Alcohol abuse with intoxication, unspecified: Secondary | ICD-10-CM

## 2016-09-21 DIAGNOSIS — J9601 Acute respiratory failure with hypoxia: Secondary | ICD-10-CM | POA: Diagnosis present

## 2016-09-21 DIAGNOSIS — J069 Acute upper respiratory infection, unspecified: Secondary | ICD-10-CM | POA: Diagnosis present

## 2016-09-21 DIAGNOSIS — M797 Fibromyalgia: Secondary | ICD-10-CM | POA: Diagnosis present

## 2016-09-21 DIAGNOSIS — T43592A Poisoning by other antipsychotics and neuroleptics, intentional self-harm, initial encounter: Principal | ICD-10-CM | POA: Diagnosis present

## 2016-09-21 DIAGNOSIS — Z818 Family history of other mental and behavioral disorders: Secondary | ICD-10-CM

## 2016-09-21 DIAGNOSIS — F1410 Cocaine abuse, uncomplicated: Secondary | ICD-10-CM | POA: Diagnosis present

## 2016-09-21 DIAGNOSIS — T50902A Poisoning by unspecified drugs, medicaments and biological substances, intentional self-harm, initial encounter: Secondary | ICD-10-CM

## 2016-09-21 DIAGNOSIS — F1020 Alcohol dependence, uncomplicated: Secondary | ICD-10-CM | POA: Diagnosis present

## 2016-09-21 DIAGNOSIS — T391X2A Poisoning by 4-Aminophenol derivatives, intentional self-harm, initial encounter: Secondary | ICD-10-CM | POA: Diagnosis present

## 2016-09-21 DIAGNOSIS — G9340 Encephalopathy, unspecified: Secondary | ICD-10-CM | POA: Diagnosis present

## 2016-09-21 DIAGNOSIS — E876 Hypokalemia: Secondary | ICD-10-CM | POA: Diagnosis present

## 2016-09-21 DIAGNOSIS — Z23 Encounter for immunization: Secondary | ICD-10-CM

## 2016-09-21 DIAGNOSIS — F319 Bipolar disorder, unspecified: Secondary | ICD-10-CM | POA: Diagnosis present

## 2016-09-21 DIAGNOSIS — Z888 Allergy status to other drugs, medicaments and biological substances status: Secondary | ICD-10-CM

## 2016-09-21 DIAGNOSIS — T50912A Poisoning by multiple unspecified drugs, medicaments and biological substances, intentional self-harm, initial encounter: Secondary | ICD-10-CM | POA: Diagnosis present

## 2016-09-21 DIAGNOSIS — T68XXXA Hypothermia, initial encounter: Secondary | ICD-10-CM | POA: Diagnosis present

## 2016-09-21 DIAGNOSIS — Z4659 Encounter for fitting and adjustment of other gastrointestinal appliance and device: Secondary | ICD-10-CM

## 2016-09-21 DIAGNOSIS — R0600 Dyspnea, unspecified: Secondary | ICD-10-CM

## 2016-09-21 DIAGNOSIS — K219 Gastro-esophageal reflux disease without esophagitis: Secondary | ICD-10-CM | POA: Diagnosis present

## 2016-09-21 DIAGNOSIS — F331 Major depressive disorder, recurrent, moderate: Secondary | ICD-10-CM | POA: Diagnosis present

## 2016-09-21 DIAGNOSIS — D696 Thrombocytopenia, unspecified: Secondary | ICD-10-CM | POA: Diagnosis not present

## 2016-09-21 DIAGNOSIS — R680 Hypothermia, not associated with low environmental temperature: Secondary | ICD-10-CM | POA: Diagnosis present

## 2016-09-21 DIAGNOSIS — F17210 Nicotine dependence, cigarettes, uncomplicated: Secondary | ICD-10-CM | POA: Diagnosis present

## 2016-09-21 DIAGNOSIS — Z811 Family history of alcohol abuse and dependence: Secondary | ICD-10-CM

## 2016-09-21 DIAGNOSIS — F10229 Alcohol dependence with intoxication, unspecified: Secondary | ICD-10-CM | POA: Diagnosis present

## 2016-09-21 DIAGNOSIS — T56891A Toxic effect of other metals, accidental (unintentional), initial encounter: Secondary | ICD-10-CM | POA: Insufficient documentation

## 2016-09-21 DIAGNOSIS — J9690 Respiratory failure, unspecified, unspecified whether with hypoxia or hypercapnia: Secondary | ICD-10-CM

## 2016-09-21 DIAGNOSIS — F411 Generalized anxiety disorder: Secondary | ICD-10-CM | POA: Diagnosis present

## 2016-09-21 DIAGNOSIS — G92 Toxic encephalopathy: Secondary | ICD-10-CM | POA: Diagnosis present

## 2016-09-21 DIAGNOSIS — J9600 Acute respiratory failure, unspecified whether with hypoxia or hypercapnia: Secondary | ICD-10-CM

## 2016-09-21 HISTORY — DX: Headache: R51

## 2016-09-21 HISTORY — DX: Headache, unspecified: R51.9

## 2016-09-21 HISTORY — DX: Anemia, unspecified: D64.9

## 2016-09-21 HISTORY — DX: Unspecified chronic bronchitis: J42

## 2016-09-21 HISTORY — DX: Anxiety disorder, unspecified: F41.9

## 2016-09-21 HISTORY — DX: Personal history of other medical treatment: Z92.89

## 2016-09-21 HISTORY — DX: Pneumonia, unspecified organism: J18.9

## 2016-09-21 LAB — CBC WITH DIFFERENTIAL/PLATELET
BASOS ABS: 0 10*3/uL (ref 0.0–0.1)
BASOS PCT: 0 %
EOS ABS: 0 10*3/uL (ref 0.0–0.7)
EOS PCT: 0 %
HCT: 36.1 % (ref 36.0–46.0)
Hemoglobin: 11.8 g/dL — ABNORMAL LOW (ref 12.0–15.0)
Lymphocytes Relative: 8 %
Lymphs Abs: 0.6 10*3/uL — ABNORMAL LOW (ref 0.7–4.0)
MCH: 30.7 pg (ref 26.0–34.0)
MCHC: 32.7 g/dL (ref 30.0–36.0)
MCV: 94 fL (ref 78.0–100.0)
Monocytes Absolute: 0.4 10*3/uL (ref 0.1–1.0)
Monocytes Relative: 5 %
Neutro Abs: 6.4 10*3/uL (ref 1.7–7.7)
Neutrophils Relative %: 87 %
PLATELETS: 169 10*3/uL (ref 150–400)
RBC: 3.84 MIL/uL — AB (ref 3.87–5.11)
RDW: 13 % (ref 11.5–15.5)
WBC: 7.4 10*3/uL (ref 4.0–10.5)

## 2016-09-21 LAB — RAPID URINE DRUG SCREEN, HOSP PERFORMED
AMPHETAMINES: NOT DETECTED
BENZODIAZEPINES: POSITIVE — AB
Barbiturates: NOT DETECTED
Cocaine: POSITIVE — AB
Opiates: NOT DETECTED
TETRAHYDROCANNABINOL: NOT DETECTED

## 2016-09-21 LAB — CBC
HEMATOCRIT: 40.2 % (ref 36.0–46.0)
HEMOGLOBIN: 13.5 g/dL (ref 12.0–15.0)
MCH: 30.6 pg (ref 26.0–34.0)
MCHC: 33.6 g/dL (ref 30.0–36.0)
MCV: 91.2 fL (ref 78.0–100.0)
Platelets: 204 10*3/uL (ref 150–400)
RBC: 4.41 MIL/uL (ref 3.87–5.11)
RDW: 12.7 % (ref 11.5–15.5)
WBC: 3.9 10*3/uL — ABNORMAL LOW (ref 4.0–10.5)

## 2016-09-21 LAB — COMPREHENSIVE METABOLIC PANEL
ALBUMIN: 4.3 g/dL (ref 3.5–5.0)
ALK PHOS: 70 U/L (ref 38–126)
ALT: 15 U/L (ref 14–54)
ALT: 15 U/L (ref 14–54)
AST: 21 U/L (ref 15–41)
AST: 26 U/L (ref 15–41)
Albumin: 3.6 g/dL (ref 3.5–5.0)
Alkaline Phosphatase: 89 U/L (ref 38–126)
Anion gap: 10 (ref 5–15)
Anion gap: 6 (ref 5–15)
BILIRUBIN TOTAL: 0.6 mg/dL (ref 0.3–1.2)
BUN: 12 mg/dL (ref 6–20)
BUN: 5 mg/dL — AB (ref 6–20)
CALCIUM: 8.2 mg/dL — AB (ref 8.9–10.3)
CHLORIDE: 105 mmol/L (ref 101–111)
CO2: 24 mmol/L (ref 22–32)
CO2: 25 mmol/L (ref 22–32)
CREATININE: 0.74 mg/dL (ref 0.44–1.00)
CREATININE: 0.76 mg/dL (ref 0.44–1.00)
Calcium: 8.7 mg/dL — ABNORMAL LOW (ref 8.9–10.3)
Chloride: 117 mmol/L — ABNORMAL HIGH (ref 101–111)
GFR calc Af Amer: >60 mL/min (ref >60)
GFR calc Af Amer: >60 mL/min (ref >60)
GLUCOSE: 153 mg/dL — AB (ref 65–99)
Glucose, Bld: 77 mg/dL (ref 65–99)
POTASSIUM: 3.4 mmol/L — AB (ref 3.5–5.1)
Potassium: 3.5 mmol/L (ref 3.5–5.1)
SODIUM: 139 mmol/L (ref 135–145)
Sodium: 148 mmol/L — ABNORMAL HIGH (ref 135–145)
TOTAL PROTEIN: 5.8 g/dL — AB (ref 6.5–8.1)
Total Bilirubin: 0.3 mg/dL (ref 0.3–1.2)
Total Protein: 7.5 g/dL (ref 6.5–8.1)

## 2016-09-21 LAB — POCT I-STAT 3, ART BLOOD GAS (G3+)
Acid-Base Excess: 8 mmol/L — ABNORMAL HIGH (ref 0.0–2.0)
Acid-Base Excess: 4 mmol/L — ABNORMAL HIGH (ref 0.0–2.0)
BICARBONATE: 28.8 mmol/L — AB (ref 20.0–28.0)
Bicarbonate: 30.8 mmol/L — ABNORMAL HIGH (ref 20.0–28.0)
O2 Saturation: 100 %
O2 Saturation: 99 %
PCO2 ART: 35.8 mmHg (ref 32.0–48.0)
PH ART: 7.455 — AB (ref 7.350–7.450)
PO2 ART: 153 mmHg — AB (ref 83.0–108.0)
TCO2: 32 mmol/L (ref 0–100)
TCO2: 30 mmol/L (ref 0–100)
pCO2 arterial: 40.9 mmHg (ref 32.0–48.0)
pH, Arterial: 7.543 — ABNORMAL HIGH (ref 7.350–7.450)
pO2, Arterial: 351 mmHg — ABNORMAL HIGH (ref 83.0–108.0)

## 2016-09-21 LAB — BASIC METABOLIC PANEL
ANION GAP: 7 (ref 5–15)
ANION GAP: 6 (ref 5–15)
ANION GAP: 10 (ref 5–15)
BUN: <5 mg/dL — ABNORMAL LOW (ref 6–20)
BUN: <5 mg/dL — ABNORMAL LOW (ref 6–20)
BUN: <5 mg/dL — ABNORMAL LOW (ref 6–20)
CO2: 26 mmol/L (ref 22–32)
CO2: 28 mmol/L (ref 22–32)
CO2: 26 mmol/L (ref 22–32)
Calcium: 7.6 mg/dL — ABNORMAL LOW (ref 8.9–10.3)
Calcium: 7.2 mg/dL — ABNORMAL LOW (ref 8.9–10.3)
Calcium: 7.9 mg/dL — ABNORMAL LOW (ref 8.9–10.3)
Chloride: 107 mmol/L (ref 101–111)
Chloride: 101 mmol/L (ref 101–111)
Chloride: 104 mmol/L (ref 101–111)
Creatinine, Ser: 0.46 mg/dL (ref 0.44–1.00)
Creatinine, Ser: 0.37 mg/dL — ABNORMAL LOW (ref 0.44–1.00)
Creatinine, Ser: 0.69 mg/dL (ref 0.44–1.00)
GFR calc Af Amer: >60 mL/min (ref >60)
GFR calc non Af Amer: >60 mL/min (ref >60)
GLUCOSE: 86 mg/dL (ref 65–99)
GLUCOSE: 90 mg/dL (ref 65–99)
GLUCOSE: 82 mg/dL (ref 65–99)
POTASSIUM: 3.2 mmol/L — AB (ref 3.5–5.1)
POTASSIUM: 3 mmol/L — AB (ref 3.5–5.1)
POTASSIUM: 2.6 mmol/L — AB (ref 3.5–5.1)
Sodium: 140 mmol/L (ref 135–145)
Sodium: 135 mmol/L (ref 135–145)
Sodium: 140 mmol/L (ref 135–145)

## 2016-09-21 LAB — ACETAMINOPHEN LEVEL: Acetaminophen (Tylenol), Serum: 15 ug/mL (ref 10–30)

## 2016-09-21 LAB — LITHIUM LEVEL
LITHIUM LVL: 4.8 mmol/L — AB (ref 0.60–1.20)
Lithium Lvl: 0.93 mmol/L (ref 0.60–1.20)

## 2016-09-21 LAB — POC URINE PREG, ED: Preg Test, Ur: NEGATIVE

## 2016-09-21 LAB — GLUCOSE, CAPILLARY
Glucose-Capillary: 78 mg/dL (ref 65–99)
Glucose-Capillary: 97 mg/dL (ref 65–99)
Glucose-Capillary: 70 mg/dL (ref 65–99)

## 2016-09-21 LAB — DIFFERENTIAL
Basophils Absolute: 0 10*3/uL (ref 0.0–0.1)
Basophils Relative: 0 %
EOS ABS: 0.1 10*3/uL (ref 0.0–0.7)
EOS PCT: 2 %
LYMPHS ABS: 1.3 10*3/uL (ref 0.7–4.0)
Lymphocytes Relative: 33 %
MONO ABS: 0.3 10*3/uL (ref 0.1–1.0)
Monocytes Relative: 8 %
NEUTROS PCT: 57 %
Neutro Abs: 2.2 10*3/uL (ref 1.7–7.7)

## 2016-09-21 LAB — VOLATILES,BLD-ACETONE,ETHANOL,ISOPROP,METHANOL
ACETONE, BLOOD: NEGATIVE % (ref 0.000–0.010)
ETHANOL, BLOOD: NEGATIVE % (ref 0.000–0.010)
ISOPROPANOL, BLOOD: NEGATIVE % (ref 0.000–0.010)
Methanol, blood: NEGATIVE % (ref 0.000–0.010)

## 2016-09-21 LAB — TSH: TSH: 2.071 u[IU]/mL (ref 0.350–4.500)

## 2016-09-21 LAB — TROPONIN I
TROPONIN I: 0.07 ng/mL — AB (ref <0.03)
Troponin I: 0.03 ng/mL (ref <0.03)

## 2016-09-21 LAB — ETHYLENE GLYCOL: Ethylene Glycol Lvl: NOT DETECTED mg/dL

## 2016-09-21 LAB — MRSA PCR SCREENING: MRSA BY PCR: NEGATIVE

## 2016-09-21 LAB — CBG MONITORING, ED: GLUCOSE-CAPILLARY: 142 mg/dL — AB (ref 65–99)

## 2016-09-21 LAB — CORTISOL: Cortisol, Plasma: 18.6 ug/dL

## 2016-09-21 LAB — SALICYLATE LEVEL
Salicylate Lvl: <7 mg/dL (ref 2.8–30.0)
Salicylate Lvl: <7 mg/dL (ref 2.8–30.0)

## 2016-09-21 LAB — CARBAMAZEPINE LEVEL, TOTAL
CARBAMAZEPINE LVL: 6.1 ug/mL (ref 4.0–12.0)
Carbamazepine Lvl: 5 ug/mL (ref 4.0–12.0)

## 2016-09-21 LAB — OSMOLALITY: Osmolality: 289 mOsm/kg (ref 275–295)

## 2016-09-21 LAB — TRIGLYCERIDES: TRIGLYCERIDES: 132 mg/dL (ref <150)

## 2016-09-21 LAB — ETHANOL: Alcohol, Ethyl (B): 151 mg/dL — ABNORMAL HIGH (ref <5)

## 2016-09-21 MED ORDER — FOLIC ACID 5 MG/ML IJ SOLN
1.0000 mg | Freq: Every day | INTRAMUSCULAR | Status: DC
Start: 2016-09-21 — End: 2016-09-24
  Administered 2016-09-21 – 2016-09-23 (×3): 1 mg via INTRAVENOUS
  Filled 2016-09-21 (×4): qty 0.2

## 2016-09-21 MED ORDER — SODIUM CHLORIDE 0.9 % IV SOLN: 100.0000 mL | INTRAVENOUS | Status: DC | PRN

## 2016-09-21 MED ORDER — SODIUM BICARBONATE 8.4 % IV SOLN
INTRAVENOUS | Status: AC
  Filled 2016-09-21: qty 150

## 2016-09-21 MED ORDER — ACETYLCYSTEINE LOAD VIA INFUSION
150.0000 mg/kg | Freq: Once | INTRAVENOUS | Status: AC
  Administered 2016-09-21: 8850 mg via INTRAVENOUS
  Filled 2016-09-21: qty 222

## 2016-09-21 MED ORDER — LORAZEPAM 2 MG/ML IJ SOLN: 1.0000 mg | Freq: Four times a day (QID) | INTRAMUSCULAR | Status: DC | PRN

## 2016-09-21 MED ORDER — VITAMIN B-1 100 MG PO TABS
100.0000 mg | ORAL_TABLET | Freq: Every day | ORAL | Status: DC
  Administered 2016-09-22 – 2016-09-26 (×5): 100 mg via ORAL
  Filled 2016-09-21 (×6): qty 1

## 2016-09-21 MED ORDER — PROPOFOL 1000 MG/100ML IV EMUL
0.0000 ug/kg/min | INTRAVENOUS | Status: DC
  Administered 2016-09-21: 5 ug/kg/min via INTRAVENOUS
  Administered 2016-09-21: 10 ug/kg/min via INTRAVENOUS
  Administered 2016-09-22: 20 ug/kg/min via INTRAVENOUS
  Filled 2016-09-21 (×2): qty 100

## 2016-09-21 MED ORDER — CHLORHEXIDINE GLUCONATE 0.12% ORAL RINSE (MEDLINE KIT)
15.0000 mL | Freq: Two times a day (BID) | OROMUCOSAL | Status: DC
  Administered 2016-09-21 – 2016-09-22 (×3): 15 mL via OROMUCOSAL

## 2016-09-21 MED ORDER — FENTANYL CITRATE (PF) 100 MCG/2ML IJ SOLN
INTRAMUSCULAR | Status: AC
  Administered 2016-09-21: 100 ug
  Filled 2016-09-21: qty 2

## 2016-09-21 MED ORDER — ADULT MULTIVITAMIN W/MINERALS CH
1.0000 | ORAL_TABLET | Freq: Every day | ORAL | Status: DC
  Administered 2016-09-21 – 2016-09-25 (×5): 1 via ORAL
  Filled 2016-09-21 (×5): qty 1

## 2016-09-21 MED ORDER — ORAL CARE MOUTH RINSE
15.0000 mL | Freq: Four times a day (QID) | OROMUCOSAL | Status: DC
  Administered 2016-09-22 (×2): 15 mL via OROMUCOSAL

## 2016-09-21 MED ORDER — FENTANYL CITRATE (PF) 100 MCG/2ML IJ SOLN
INTRAMUSCULAR | Status: AC
  Filled 2016-09-21: qty 2

## 2016-09-21 MED ORDER — ALTEPLASE 2 MG IJ SOLR
2.0000 mg | Freq: Once | INTRAMUSCULAR | Status: DC | PRN
  Filled 2016-09-21: qty 2

## 2016-09-21 MED ORDER — INSULIN ASPART 100 UNIT/ML ~~LOC~~ SOLN
0.0000 [IU] | SUBCUTANEOUS | Status: DC
  Administered 2016-09-22 – 2016-09-23 (×6): 1 [IU] via SUBCUTANEOUS
  Administered 2016-09-23: 2 [IU] via SUBCUTANEOUS

## 2016-09-21 MED ORDER — HEPARIN SODIUM (PORCINE) 1000 UNIT/ML DIALYSIS
20.0000 [IU]/kg | INTRAMUSCULAR | Status: DC | PRN
  Filled 2016-09-21: qty 2

## 2016-09-21 MED ORDER — FENTANYL CITRATE (PF) 100 MCG/2ML IJ SOLN: 50.0000 ug | Freq: Once | INTRAMUSCULAR | Status: AC

## 2016-09-21 MED ORDER — SODIUM CHLORIDE 0.9 % IV SOLN
INTRAVENOUS | Status: DC
  Administered 2016-09-21: 06:00:00 via INTRAVENOUS

## 2016-09-21 MED ORDER — DEXTROSE 5 % IV SOLN
Freq: Once | INTRAVENOUS | Status: DC
  Filled 2016-09-21: qty 150

## 2016-09-21 MED ORDER — THIAMINE HCL 100 MG/ML IJ SOLN
100.0000 mg | Freq: Every day | INTRAMUSCULAR | Status: DC
  Administered 2016-09-21: 100 mg via INTRAVENOUS
  Filled 2016-09-21 (×2): qty 1
  Filled 2016-09-21: qty 2

## 2016-09-21 MED ORDER — FUROSEMIDE 10 MG/ML IJ SOLN: 40.0000 mg | Freq: Once | INTRAMUSCULAR | Status: DC

## 2016-09-21 MED ORDER — LORAZEPAM 1 MG PO TABS: 1.0000 mg | ORAL_TABLET | Freq: Four times a day (QID) | ORAL | Status: DC | PRN

## 2016-09-21 MED ORDER — FAMOTIDINE IN NACL 20-0.9 MG/50ML-% IV SOLN
20.0000 mg | Freq: Two times a day (BID) | INTRAVENOUS | Status: DC
  Administered 2016-09-21 – 2016-09-24 (×6): 20 mg via INTRAVENOUS
  Filled 2016-09-21 (×8): qty 50

## 2016-09-21 MED ORDER — SODIUM CHLORIDE 0.9 % IV SOLN
INTRAVENOUS | Status: DC
  Administered 2016-09-21: 09:00:00 via INTRAVENOUS

## 2016-09-21 MED ORDER — SODIUM CHLORIDE 0.9 % IV SOLN
INTRAVENOUS | Status: DC
  Administered 2016-09-21 – 2016-09-23 (×4): via INTRAVENOUS

## 2016-09-21 MED ORDER — ETOMIDATE 2 MG/ML IV SOLN
0.3000 mg/kg | Freq: Once | INTRAVENOUS | Status: AC
  Administered 2016-09-21: 20 mg via INTRAVENOUS

## 2016-09-21 MED ORDER — FENTANYL BOLUS VIA INFUSION
50.0000 ug | INTRAVENOUS | Status: DC | PRN
  Administered 2016-09-21 – 2016-09-22 (×2): 50 ug via INTRAVENOUS
  Filled 2016-09-21: qty 50

## 2016-09-21 MED ORDER — SODIUM CHLORIDE 0.9 % IV SOLN
100.0000 mL | INTRAVENOUS | Status: DC | PRN
Start: 2016-09-21 — End: 2016-09-24

## 2016-09-21 MED ORDER — MIDAZOLAM HCL 2 MG/2ML IJ SOLN
INTRAMUSCULAR | Status: AC
  Administered 2016-09-21: 2 mg
  Filled 2016-09-21: qty 2

## 2016-09-21 MED ORDER — MIDAZOLAM HCL 2 MG/2ML IJ SOLN
2.0000 mg | INTRAMUSCULAR | Status: DC | PRN
  Administered 2016-09-21: 2 mg via INTRAVENOUS

## 2016-09-21 MED ORDER — ROCURONIUM BROMIDE 50 MG/5ML IV SOLN
1.0000 mg/kg | Freq: Once | INTRAVENOUS | Status: AC
  Administered 2016-09-21: 50 mg via INTRAVENOUS

## 2016-09-21 MED ORDER — HEPARIN SODIUM (PORCINE) 1000 UNIT/ML DIALYSIS
1000.0000 [IU] | INTRAMUSCULAR | Status: DC | PRN
  Filled 2016-09-21: qty 1

## 2016-09-21 MED ORDER — FOLIC ACID 1 MG PO TABS
1.0000 mg | ORAL_TABLET | Freq: Every day | ORAL | Status: DC
  Filled 2016-09-21: qty 1

## 2016-09-21 MED ORDER — FENTANYL 2500MCG IN NS 250ML (10MCG/ML) PREMIX INFUSION
25.0000 ug/h | INTRAVENOUS | Status: DC
  Administered 2016-09-21: 200 ug/h via INTRAVENOUS
  Administered 2016-09-21: 50 ug/h via INTRAVENOUS
  Filled 2016-09-21 (×2): qty 250

## 2016-09-21 MED ORDER — MIDAZOLAM HCL 2 MG/2ML IJ SOLN
INTRAMUSCULAR | Status: AC
  Filled 2016-09-21: qty 2

## 2016-09-21 MED ORDER — HEPARIN SODIUM (PORCINE) 1000 UNIT/ML IJ SOLN
3000.0000 [IU] | Freq: Once | INTRAMUSCULAR | Status: AC
  Administered 2016-09-21: 3000 [IU] via INTRAVENOUS
  Filled 2016-09-21: qty 3

## 2016-09-21 MED ORDER — PROPOFOL 1000 MG/100ML IV EMUL
INTRAVENOUS | Status: AC
  Filled 2016-09-21: qty 100

## 2016-09-21 MED ORDER — FENTANYL CITRATE (PF) 100 MCG/2ML IJ SOLN
100.0000 ug | INTRAMUSCULAR | Status: DC | PRN
  Administered 2016-09-21: 100 ug via INTRAVENOUS

## 2016-09-21 MED ORDER — DEXTROSE 5 % IV SOLN
INTRAVENOUS | Status: DC
  Administered 2016-09-21 – 2016-09-23 (×3): via INTRAVENOUS
  Filled 2016-09-21 (×7): qty 150

## 2016-09-21 MED ORDER — DEXTROSE 5 % IV SOLN
15.0000 mg/kg/h | INTRAVENOUS | Status: DC
  Filled 2016-09-21 (×2): qty 150

## 2016-09-21 MED ORDER — SODIUM CHLORIDE 0.9 % IV SOLN
30.0000 meq | Freq: Once | INTRAVENOUS | Status: AC
Start: 2016-09-21 — End: 2016-09-21
  Administered 2016-09-21: 30 meq via INTRAVENOUS
  Filled 2016-09-21: qty 15
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
## 2016-09-21 NOTE — Procedures (Signed)
Intubation Procedure Note Jearld Shinesmanda Reist [id number] 04/07/1981  Procedure: Intubation Indications: Airway protection and maintenance  Procedure Details Consent: Unable to obtain consent because of emergent medical necessity. Time Out: Verified patient identification, verified procedure, site/side was marked, verified correct patient position, special equipment/implants available, medications/allergies/relevent history reviewed, required imaging and test results available.  Performed  MAC and 3 Medications:  Fentanyl 100mcg Etomidate 20mg  Versed 2mg  NMB    Evaluation Hemodynamic Status: BP stable throughout; O2 sats: stable throughout Patient's Current Condition: stable Complications: No apparent complications Patient did tolerate procedure well. Chest X-ray ordered to verify placement.  CXR: pending.   Brett CanalesSteve Minor ACNP [facility] Pager [phone] till 3 pm If no answer page [phone] 09/21/2016, 9:24 AM    Emergent need, severe agitation , unable to treat  Mcarthur Rossettianiel J. Tyson AliasFeinstein, MD, FACP Pgr: [phone] [facility]

## 2016-09-21 NOTE — Progress Notes (Signed)
CSW consult acknowledged re: intentional overdose. CSW following for psych recommendation, substance abuse concerns, and disposition. Patient presently intubated.          Lance MussAshley Gardner,MSW, LCSW [facility] Clinical Social Worker [phone]
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
## 2016-09-21 NOTE — Progress Notes (Signed)
Initial Nutrition Assessment  DOCUMENTATION CODES:   Not applicable  INTERVENTION:    If unable to extubate patient tomorrow, recommend initiate TF via OGT with Vital AF 1.2 at goal rate of 45 ml/h (1080 ml per day) to provide 1296 kcals, 81 gm protein, 876 ml free water daily.  Total intake with TF + Propofol would be 1483 kcal.  NUTRITION DIAGNOSIS:   Inadequate oral intake related to inability to eat as evidenced by NPO status.  GOAL:   Patient will meet greater than or equal to 90% of their needs  MONITOR:   Vent status, Labs, Weight trends, I & O's  REASON FOR ASSESSMENT:   Ventilator     ASSESSMENT:   35 yo female with hx of bipolar D/O, [facility] with polysubstance OD (tylenol/lithium). Required intubation on admission.  Currently requiring HD for Lithium OD.  Discussed patient with physician and RN today. Will likely be extubated tomorrow. No plans to start TF today. Nutrition focused physical exam completed.  No muscle or subcutaneous fat depletion noticed. Patient is currently intubated on ventilator support MV: 6.3 L/min Temp (24hrs), Avg:96.5 F (35.8 C), Min:95 F (35 C), Max:99 F (37.2 C)  Propofol: 7.1 ml/hr providing 187 kcal per day   Diet Order:  Diet NPO time specified  Skin:  Reviewed, no issues  Last BM:  unknown  Height:   Ht Readings from Last 1 Encounters:  09/21/16 5\' 7"  (1.702 m)    Weight:   Wt Readings from Last 1 Encounters:  09/21/16 136 lb 7.4 oz (61.9 kg)    Ideal Body Weight:  61.4 kg  BMI:  Body mass index is 21.37 kg/m.  Estimated Nutritional Needs:   Kcal:  1481  Protein:  80-90 gm  Fluid:  >/= 1.5 L  EDUCATION NEEDS:   No education needs identified at this time  Joaquin CourtsKimberly Nura Cahoon, RD, LDN, CNSC Pager [phone] After Hours Pager [phone]

## 2016-09-21 NOTE — Procedures (Signed)
Hemodialysis for Lithium OD Personally supervised. 4K bath, no volume off Clotted system, restrung. HR 120-140 and bolusing fluid. Access temp R IJ placed by CCM.  Camille Balynthia Ritter Helsley, MD [facility] [phone] Pager 09/21/2016, 12:37 PM

## 2016-09-21 NOTE — Plan of Care (Signed)
Problem: Safety: Goal: Ability to remain free from injury will improve Outcome: Progressing UTA. Pt sedated/intubated.  Problem: Health Behavior/Discharge Planning: Goal: Ability to manage health-related needs will improve Outcome: Not Progressing UTA. Pt sedated/intubated.  Problem: Physical Regulation: Goal: Ability to maintain clinical measurements within normal limits will improve Outcome: Not Progressing UTA. Pt sedated/intubated.  Problem: Activity: Goal: Risk for activity intolerance will decrease Outcome: Not Progressing UTA. Pt sedated/intubated.  Problem: Nutrition: Goal: Adequate nutrition will be maintained Outcome: Not Progressing UTA. Pt sedated/intubated. No tube feedings at this time

## 2016-09-21 NOTE — ED Notes (Signed)
EMS gave 5mg  versed and 5mg  of haldol in route because pt became combative once she got in the ambulance

## 2016-09-21 NOTE — ED Notes (Signed)
Update to Poison Control

## 2016-09-21 NOTE — ED Notes (Addendum)
Spoke with Patty from [employer]: -Seizure precautions -CNS/Resp depression -QRS widening-use Bicarb boluses and push until QRS's have narrowed -Hypotension-use fluids first and then Nor-epi for continued hypotension -Use Benzo's for seizures -Repeat Lithium and Carbamazepin levels every 4-6 hours -only give charcoal if intubated -Hypertension  -QTc prolonged
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
## 2016-09-21 NOTE — Progress Notes (Signed)
Troponin of 0.04 was reported to MD at the bedside.

## 2016-09-21 NOTE — Procedures (Signed)
Intubation Procedure Note Jearld Shinesmanda Nauta [id number] 05/22/1981  Procedure: Intubation Indications: Airway protection and maintenance  Procedure Details Consent: Risks of procedure as well as the alternatives and risks of each were explained to the (patient/caregiver).  Consent for procedure obtained. and Unable to obtain consent because of altered level of consciousness. Time Out: Verified patient identification, verified procedure, site/side was marked, verified correct patient position, special equipment/implants available, medications/allergies/relevent history reviewed, required imaging and test results available.  Performed  Maximum sterile technique was used including gloves, hand hygiene and mask.  MAC and 3    Evaluation Hemodynamic Status: BP stable throughout; O2 sats: stable throughout Patient's Current Condition: stable Complications: No apparent complications Patient did tolerate procedure well. Chest X-ray ordered to verify placement.  CXR: pending.   Ave Filterdkins, Virlan Kempker Williams 09/21/2016

## 2016-09-21 NOTE — ED Notes (Signed)
Bear hugger removed from patient  

## 2016-09-21 NOTE — ED Notes (Signed)
[facility] notified of need for transport 

## 2016-09-21 NOTE — ED Notes (Signed)
Bed: RESB Expected date:  Expected time:  Means of arrival:  Comments: EMS 35 yo OD

## 2016-09-21 NOTE — Progress Notes (Signed)
Pt arrived on the unit. Agitated, punching stuff, confused. Fentanyl and versed was not enough to sedate pt for HD cath placement. Pt was intubated. Procedure was done.  Propofol and Fentanyl started. Significant other called and consent was taken for HD.
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
## 2016-09-21 NOTE — Procedures (Signed)
Hemodialysis Insertion Procedure Note Sarah Ellis [id number] 01/29/1981  Procedure: Insertion of Hemodialysis Catheter Type: 3 port  Indications: Hemodialysis   Procedure Details Consent: Unable to obtain consent because of emergent medical necessity. Time Out: Verified patient identification, verified procedure, site/side was marked, verified correct patient position, special equipment/implants available, medications/allergies/relevent history reviewed, required imaging and test results available.  Performed  Maximum sterile technique was used including antiseptics, cap, gloves, gown, hand hygiene, mask and sheet. Skin prep: Chlorhexidine; local anesthetic administered A antimicrobial bonded/coated triple lumen catheter was placed in the right internal jugular vein using the Seldinger technique. Ultrasound guidance used.Yes.   Catheter placed to 16 cm. Blood aspirated via all 3 ports and then flushed x 3. Line sutured x 2 and dressing applied.  Evaluation Blood flow good Complications: No apparent complications Patient did tolerate procedure well. Chest X-ray ordered to verify placement.  CXR: pending.  Sarah CanalesSteve Ahlijah Ellis ACNP Sarah PollackLe Ellis PCCM Pager [phone] till 3 pm If no answer page [phone] 09/21/2016, 9:25 AM

## 2016-09-21 NOTE — ED Notes (Signed)
Pt told fire department that she took 50-60 doxipen and 40 lithium tablets, however she has lots of pill bottles that are open and loose. Pt admits to fire department that she was suicidal

## 2016-09-22 ENCOUNTER — Inpatient Hospital Stay (HOSPITAL_COMMUNITY): Payer: Self-pay

## 2016-09-22 DIAGNOSIS — G9340 Encephalopathy, unspecified: Secondary | ICD-10-CM

## 2016-09-22 DIAGNOSIS — F1020 Alcohol dependence, uncomplicated: Secondary | ICD-10-CM

## 2016-09-22 LAB — PROTIME-INR
INR: 1.16
Prothrombin Time: 14.9 seconds (ref 11.4–15.2)

## 2016-09-22 LAB — HEPATIC FUNCTION PANEL
ALBUMIN: 2.6 g/dL — AB (ref 3.5–5.0)
ALK PHOS: 71 U/L (ref 38–126)
ALT: 15 U/L (ref 14–54)
AST: 20 U/L (ref 15–41)
BILIRUBIN TOTAL: 0.7 mg/dL (ref 0.3–1.2)
Bilirubin, Direct: 0.1 mg/dL (ref 0.1–0.5)
Indirect Bilirubin: 0.6 mg/dL (ref 0.3–0.9)
TOTAL PROTEIN: 4.6 g/dL — AB (ref 6.5–8.1)

## 2016-09-22 LAB — RENAL FUNCTION PANEL
Albumin: 2.3 g/dL — ABNORMAL LOW (ref 3.5–5.0)
Anion gap: 5 (ref 5–15)
CALCIUM: 8 mg/dL — AB (ref 8.9–10.3)
CHLORIDE: 104 mmol/L (ref 101–111)
CO2: 31 mmol/L (ref 22–32)
CREATININE: 0.69 mg/dL (ref 0.44–1.00)
Glucose, Bld: 152 mg/dL — ABNORMAL HIGH (ref 65–99)
Phosphorus: 4.2 mg/dL (ref 2.5–4.6)
Potassium: 2.5 mmol/L — CL (ref 3.5–5.1)
SODIUM: 140 mmol/L (ref 135–145)

## 2016-09-22 LAB — BASIC METABOLIC PANEL
Anion gap: 6 (ref 5–15)
Anion gap: 6 (ref 5–15)
Anion gap: 5 (ref 5–15)
CALCIUM: 8.1 mg/dL — AB (ref 8.9–10.3)
CALCIUM: 8.2 mg/dL — AB (ref 8.9–10.3)
CALCIUM: 8 mg/dL — AB (ref 8.9–10.3)
CHLORIDE: 106 mmol/L (ref 101–111)
CHLORIDE: 103 mmol/L (ref 101–111)
CO2: 27 mmol/L (ref 22–32)
CO2: 29 mmol/L (ref 22–32)
CO2: 31 mmol/L (ref 22–32)
CREATININE: 0.74 mg/dL (ref 0.44–1.00)
CREATININE: 0.74 mg/dL (ref 0.44–1.00)
CREATININE: 0.69 mg/dL (ref 0.44–1.00)
Chloride: 104 mmol/L (ref 101–111)
Glucose, Bld: 97 mg/dL (ref 65–99)
Glucose, Bld: 126 mg/dL — ABNORMAL HIGH (ref 65–99)
Glucose, Bld: 152 mg/dL — ABNORMAL HIGH (ref 65–99)
Potassium: 2.9 mmol/L — ABNORMAL LOW (ref 3.5–5.1)
Potassium: 2.8 mmol/L — ABNORMAL LOW (ref 3.5–5.1)
Potassium: 2.5 mmol/L — CL (ref 3.5–5.1)
SODIUM: 139 mmol/L (ref 135–145)
SODIUM: 138 mmol/L (ref 135–145)
SODIUM: 140 mmol/L (ref 135–145)

## 2016-09-22 LAB — URINALYSIS, ROUTINE W REFLEX MICROSCOPIC
Bilirubin Urine: NEGATIVE
GLUCOSE, UA: NEGATIVE mg/dL
KETONES UR: 15 mg/dL — AB
LEUKOCYTES UA: NEGATIVE
Nitrite: NEGATIVE
PH: 8.5 — AB (ref 5.0–8.0)
PROTEIN: NEGATIVE mg/dL
Specific Gravity, Urine: 1.011 (ref 1.005–1.030)

## 2016-09-22 LAB — APTT: aPTT: 28 seconds (ref 24–36)

## 2016-09-22 LAB — GLUCOSE, CAPILLARY
GLUCOSE-CAPILLARY: 131 mg/dL — AB (ref 65–99)
GLUCOSE-CAPILLARY: 78 mg/dL (ref 65–99)
GLUCOSE-CAPILLARY: 98 mg/dL (ref 65–99)
Glucose-Capillary: 132 mg/dL — ABNORMAL HIGH (ref 65–99)
Glucose-Capillary: 123 mg/dL — ABNORMAL HIGH (ref 65–99)
Glucose-Capillary: 121 mg/dL — ABNORMAL HIGH (ref 65–99)

## 2016-09-22 LAB — ACETAMINOPHEN LEVEL

## 2016-09-22 LAB — URINE MICROSCOPIC-ADD ON: WBC, UA: NONE SEEN WBC/hpf (ref 0–5)

## 2016-09-22 LAB — LITHIUM LEVEL
LITHIUM LVL: 1.63 mmol/L — AB (ref 0.60–1.20)
LITHIUM LVL: 1.18 mmol/L (ref 0.60–1.20)

## 2016-09-22 LAB — HEPATITIS B SURFACE ANTIGEN: Hepatitis B Surface Ag: NEGATIVE

## 2016-09-22 LAB — CBC
HCT: 31 % — ABNORMAL LOW (ref 36.0–46.0)
HEMOGLOBIN: 10.1 g/dL — AB (ref 12.0–15.0)
MCH: 30.8 pg (ref 26.0–34.0)
MCHC: 32.6 g/dL (ref 30.0–36.0)
MCV: 94.5 fL (ref 78.0–100.0)
Platelets: 106 10*3/uL — ABNORMAL LOW (ref 150–400)
RBC: 3.28 MIL/uL — AB (ref 3.87–5.11)
RDW: 13.3 % (ref 11.5–15.5)
WBC: 6 10*3/uL (ref 4.0–10.5)

## 2016-09-22 LAB — MAGNESIUM: Magnesium: 1.7 mg/dL (ref 1.7–2.4)

## 2016-09-22 LAB — TROPONIN I: TROPONIN I: 0.03 ng/mL — AB (ref <0.03)

## 2016-09-22 LAB — PHOSPHORUS: PHOSPHORUS: 1.6 mg/dL — AB (ref 2.5–4.6)

## 2016-09-22 MED ORDER — POTASSIUM CHLORIDE 10 MEQ/50ML IV SOLN: 10.0000 meq | INTRAVENOUS | Status: DC

## 2016-09-22 MED ORDER — FENTANYL CITRATE (PF) 100 MCG/2ML IJ SOLN
25.0000 ug | INTRAMUSCULAR | Status: DC | PRN
  Administered 2016-09-22 – 2016-09-24 (×20): 100 ug via INTRAVENOUS
  Administered 2016-09-24: 50 ug via INTRAVENOUS
  Administered 2016-09-24 (×2): 100 ug via INTRAVENOUS
  Administered 2016-09-24: 50 ug via INTRAVENOUS
  Filled 2016-09-22 (×24): qty 2

## 2016-09-22 MED ORDER — FENTANYL CITRATE (PF) 100 MCG/2ML IJ SOLN
12.5000 ug | INTRAMUSCULAR | Status: DC | PRN
  Administered 2016-09-22: 25 ug via INTRAVENOUS
  Filled 2016-09-22: qty 2

## 2016-09-22 MED ORDER — SODIUM CHLORIDE 0.9 % IV SOLN
1.0000 g | Freq: Three times a day (TID) | INTRAVENOUS | Status: DC
  Administered 2016-09-22: 1 g via INTRAVENOUS
  Filled 2016-09-22 (×3): qty 1

## 2016-09-22 MED ORDER — POTASSIUM CHLORIDE 2 MEQ/ML IV SOLN
30.0000 meq | INTRAVENOUS | Status: AC
  Administered 2016-09-22 (×2): 30 meq via INTRAVENOUS
  Filled 2016-09-22 (×2): qty 15

## 2016-09-22 MED ORDER — ORAL CARE MOUTH RINSE
15.0000 mL | Freq: Two times a day (BID) | OROMUCOSAL | Status: DC
  Administered 2016-09-22: 15 mL via OROMUCOSAL

## 2016-09-22 MED ORDER — POTASSIUM PHOSPHATES 15 MMOLE/5ML IV SOLN
30.0000 meq | Freq: Once | INTRAVENOUS | Status: AC
  Administered 2016-09-22: 30 meq via INTRAVENOUS
  Filled 2016-09-22 (×2): qty 6.82

## 2016-09-22 MED ORDER — VANCOMYCIN HCL IN DEXTROSE 750-5 MG/150ML-% IV SOLN
750.0000 mg | Freq: Three times a day (TID) | INTRAVENOUS | Status: DC
  Administered 2016-09-22: 750 mg via INTRAVENOUS
  Filled 2016-09-22 (×3): qty 150

## 2016-09-22 MED ORDER — IBUPROFEN 100 MG/5ML PO SUSP
400.0000 mg | Freq: Four times a day (QID) | ORAL | Status: DC | PRN
  Administered 2016-09-22: 400 mg
  Filled 2016-09-22 (×3): qty 20

## 2016-09-22 MED ORDER — POTASSIUM CHLORIDE 20 MEQ/15ML (10%) PO SOLN
30.0000 meq | Freq: Once | ORAL | Status: AC
  Administered 2016-09-22: 30 meq
  Filled 2016-09-22: qty 30

## 2016-09-22 MED ORDER — SODIUM CHLORIDE 0.9 % IV SOLN
30.0000 meq | Freq: Once | INTRAVENOUS | Status: AC
  Administered 2016-09-22: 30 meq via INTRAVENOUS
  Filled 2016-09-22: qty 15

## 2016-09-22 MED ORDER — HEPARIN SODIUM (PORCINE) 5000 UNIT/ML IJ SOLN
5000.0000 [IU] | Freq: Three times a day (TID) | INTRAMUSCULAR | Status: DC
  Administered 2016-09-22 – 2016-09-26 (×13): 5000 [IU] via SUBCUTANEOUS
  Filled 2016-09-22 (×14): qty 1

## 2016-09-22 MED ORDER — IBUPROFEN 100 MG/5ML PO SUSP
400.0000 mg | Freq: Four times a day (QID) | ORAL | Status: DC | PRN
  Administered 2016-09-22 – 2016-09-24 (×3): 400 mg via ORAL
  Filled 2016-09-22 (×5): qty 20

## 2016-09-22 NOTE — Progress Notes (Signed)
Pharmacy Antibiotic Note  Sarah Ellis is a 35 y.o. female admitted on 09/21/2016 with polysubstance overdose.  Pharmacy has been consulted for Vancomycin/Merrem dosing. WBC WNL. Renal function good (did require HD session for Lithium toxicity).   Plan: -Vancomycin 750 mg IV q8h -Merrem 1g IV q8h -Trend WBC, temp, renal function  -Drug levels as indicated  -F/U to make sure no further HD sessions are required--would need additional vancomycin if so  Height: 5\' 7"  (170.2 cm) Weight: 149 lb 11.1 oz (67.9 kg) IBW/kg (Calculated) : 61.6  Temp (24hrs), Avg:97 F (36.1 C), Min:95 F (35 C), Max:101.1 F (38.4 C)   Recent Labs Lab 09/18/16 1954 09/21/16 0332 09/21/16 0931 09/21/16 1109 09/21/16 1504 09/21/16 2307  WBC 7.0 3.9* 7.4  --   --   --   CREATININE 0.76 0.74 0.76 0.46 0.37* 0.69    Estimated Creatinine Clearance: 93.6 mL/min (by C-G formula based on SCr of 0.69 mg/dL).    Allergies  Allergen Reactions  . Benadryl [Diphenhydramine] Other (See Comments)    Causes uncontrollable leg movement  . Keflex [Cephalexin] Swelling    Abran DukeLedford, Jaythan Hinely 09/22/2016 1:44 AM

## 2016-09-22 NOTE — Progress Notes (Addendum)
PULMONARY / CRITICAL CARE MEDICINE   Name: Sarah Ellis MRN: [id number] DOB: 1981-08-24    ADMISSION DATE:  09/21/2016 CONSULTATION DATE:  11/10  REFERRING MD:  [facility]  CHIEF COMPLAINT:  OD/AMS  BRIEF: 35 y/o female with polysubstance abuse admitted on 11/10 with Li Overdose and APAP overdose.   SOCIAL HISTORY: She  reports that she has been smoking Cigarettes.  She has been smoking about 0.50 packs per day. She has never used smokeless tobacco. She reports that she drinks alcohol. She reports that she uses drugs, including Cocaine.  REVIEW OF SYSTEMS:   Cannot obtain due to intubation  SUBJECTIVE:  HD yesterday, on spontaneous breathing trial this morning   VITAL SIGNS: BP 122/78   Pulse 93   Temp 100.3 F (37.9 C) (Oral)   Resp 11   Ht 5\' 7"  (1.702 m)   Wt 67.9 kg (149 lb 11.1 oz)   LMP 09/13/2016 (Exact Date)   SpO2 100%   BMI 23.45 kg/m   HEMODYNAMICS:    VENTILATOR SETTINGS: Vent Mode: CPAP;PSV FiO2 (%):  [30 %-100 %] 30 % Set Rate:  [10 bmp-18 bmp] 10 bmp Vt Set:  [500 mL] 500 mL PEEP:  [5 cmH20] 5 cmH20 Pressure Support:  [5 cmH20] 5 cmH20 Plateau Pressure:  [9 cmH20-14 cmH20] 14 cmH20  INTAKE / OUTPUT: I/O last 3 completed shifts: In: 9001.6 [I.V.:8436.6; IV Piggyback:565] Out: 4685 [Urine:5400]  PHYSICAL EXAMINATION: General: resting in bed, follows commands HENT: NCAT, ETT in place PULM: CTA B, vent supported breaths CV: RRR, no mgr GI: BS+, soft, nontender MSK: normal bulk and tone Neuro: awake, follows commands  LABS:  BMET  Recent Labs Lab 09/21/16 1504 09/21/16 2307 09/22/16 0532  NA 135 140 139  K 3.0* 2.6* 2.9*  CL 101 104 106  CO2 28 26 27   BUN <5* <5* <5*  CREATININE 0.37* 0.69 0.74  GLUCOSE 90 82 97    Electrolytes  Recent Labs Lab 09/21/16 1504 09/21/16 2307 09/22/16 0532  CALCIUM 7.2* 7.9* 8.1*  MG  --   --  1.7  PHOS  --   --  1.6*    CBC  Recent Labs Lab 09/21/16 0332 09/21/16 0931 09/22/16 0532   WBC 3.9* 7.4 6.0  HGB 13.5 11.8* 10.1*  HCT 40.2 36.1 31.0*  PLT 204 169 106*    Coag's  Recent Labs Lab 09/22/16 0532  APTT 28  INR 1.16    Sepsis Markers No results for input(s): LATICACIDVEN, PROCALCITON, O2SATVEN in the last 168 hours.  ABG  Recent Labs Lab 09/21/16 0926 09/21/16 1227  PHART 7.543* 7.455*  PCO2ART 35.8 40.9  PO2ART 351.0* 153.0*    Liver Enzymes  Recent Labs Lab 09/18/16 1954 09/21/16 0332 09/21/16 0931  AST 40 21 26  ALT 10* 15 15  ALKPHOS 102 89 70  BILITOT 1.2 0.3 0.6  ALBUMIN 4.3 4.3 3.6    Cardiac Enzymes  Recent Labs Lab 09/21/16 0931 09/21/16 1504 09/21/16 2307  TROPONINI 0.03* 0.07* 0.03*    Glucose  Recent Labs Lab 09/21/16 0332 09/21/16 1208 09/21/16 1524 09/21/16 2012 09/22/16 0010 09/22/16 0343  GLUCAP 142* 78 97 70 98 132*    Imaging Dg Chest Port 1 View  Result Date: 09/22/2016 CLINICAL DATA:  Intubated, hemodialysis catheter placement, enteric tube placement EXAM: PORTABLE CHEST 1 VIEW COMPARISON:  Chest radiograph from one day prior. FINDINGS: Endotracheal tube tip is 3.1 cm above the carina. Enteric tube enters stomach with the tip not seen on this  image. Right internal jugular central venous catheter terminates in the lower third of the superior vena cava. Stable cardiomediastinal silhouette with normal heart size. No pneumothorax. No pleural effusion. Lungs appear clear, with no acute consolidative airspace disease and no pulmonary edema. IMPRESSION: 1. Well-positioned support structures as described. 2. No pneumothorax.  No active cardiopulmonary disease. Electronically Signed   By: Delbert PhenixJason A Poff M.D.   On: 09/22/2016 08:29   Dg Chest Portable 1 View  Result Date: 09/21/2016 CLINICAL DATA:  Intubation.  Hemo dialysis catheter placement. EXAM: PORTABLE CHEST 1 VIEW COMPARISON:  Three days ago FINDINGS: Endotracheal tube tip just below the clavicular heads. An orogastric tube tip overlaps the distal  esophagus. Dialysis catheter on the right with tip in the region of the distal SVC. Mediastinal contours are distorted by leftward rotation. Normal heart size. No pneumothorax. No edema or consolidation. These results will be called to the ordering clinician or representative by the Radiologist Assistant, and communication documented in the PACS or zVision Dashboard. IMPRESSION: 1. Short orogastric tube. Advancement by approximately 10 cm would place the side port in the stomach. 2. Unremarkable endotracheal tube. 3. Dialysis catheter overlaps the SVC. No pneumothorax or other acute cardiopulmonary finding. Electronically Signed   By: Marnee SpringJonathon  Watts M.D.   On: 09/21/2016 09:57   Dg Abd Portable 1v  Result Date: 09/21/2016 CLINICAL DATA:  NG tube placement. EXAM: PORTABLE ABDOMEN - 1 VIEW COMPARISON:  Chest x-ray 09/18/2016. FINDINGS: Motion artifact noted . NG tube noted with tip over the distal stomach. No bowel distention or free air. No acute bony abnormality. IMPRESSION: NG tube noted with its tip projected over the stomach. Electronically Signed   By: Maisie Fushomas  Register   On: 09/21/2016 17:12     STUDIES:    CULTURES: 11/10 blood >   ANTIBIOTICS: vanc 11/10 > 11/11 mero 11/10 > 11/11  SIGNIFICANT EVENTS: 11/10 OD  LINES/TUBES:   DISCUSSION: 35 yo bipolar admitted 11/10 with polysubstance OD(tylenol/lithium) admitted for APAP and Li overdose required emergent hemodialysis.  ASSESSMENT / PLAN:  PULMONARY A: Tobacco abuse Cocaine abuse Acute respiratory failure with hypoxemia> intubated for airway protection P:   Extubate this morning SLP eval post extubation  CARDIOVASCULAR A:  No acute issues P:  Tele monitoring Monitor Hemodynamics  RENAL A:   Lithium toxicity but no evidence of kidney injury P:   Hold off on HD but monitor Li level and symptoms (encephalopathy) Monitor BMET and UOP Replace electrolytes as needed Renal appreciated  GASTROINTESTINAL A:   GI  protection P:   PPI SLP eval after extubation  HEMATOLOGIC A:   Tylenol overdose without evidence of liver injury P:  LFT check again now Mucomyst per pharm > complete protocol  INFECTIOUS A:   Fever overnight, no clear sign of infection P:   Stop antibiotics  ENDOCRINE A:   No acute issue  P:   Follow glucose  NEUROLOGIC A:   Acute encephalopathy in setting of lithium overdose Polysubstance abuse At risk for EtOH withdrawal P:   Monitor for sign of withdrawal Stop PAD protocol Thiamine and folic acid ETOH wd protocol Discussed with Dr. Eliott Nineunham, will repeat Li level in a few hours, would only repeat HD if Li up or worsening symptoms   FAMILY  - Updates:  No family at bedside  - Inter-disciplinary family meet or Palliative Care meeting due by: 09/28/16   My cc time 35 minutes  Heber CarolinaBrent Jeury Mcnab, MD [facility] Pager: [phone] Cell: [phone] After 3pm or  if no response, call [phone]  09/22/2016 8:57 AM

## 2016-09-22 NOTE — Progress Notes (Signed)
Lithium critical 1.63 was called. Reported to Dr. Kendrick FriesMcQuaid and Dr. Eliott Nineunham (Nephrology) immidiately. Order to repeat in 4 hr was placed.

## 2016-09-22 NOTE — Progress Notes (Signed)
[facility] Brief Note   Repeat lithium level  From around 1 PM down to 1.16 from 1.63 so clearing lithium. No additional HD should be required.  Camille Balynthia Fitz Matsuo, MD [facility] [phone] Pager 09/22/2016, 3:50 PM

## 2016-09-22 NOTE — Progress Notes (Signed)
eLink Physician-Brief Progress Note Patient Name: Sarah Ellis DOB: 05/04/1981 MRN: [id number]   Date of Service  09/22/2016  HPI/Events of Note  K + = 2.6 and Creatinine = 0.69 - increase urine output likely d/t nephrogenic DI d/t Lithium toxicity.   eICU Interventions  Will replace K+.     Intervention Category Intermediate Interventions: Electrolyte abnormality - evaluation and management  Sommer,Steven Eugene 09/22/2016, 12:07 AM

## 2016-09-22 NOTE — Procedures (Signed)
Extubation Procedure Note  Patient Details:   Name: Sarah Ellis DOB: 02/28/1981 MRN: [id number]   Airway Documentation:     Evaluation  O2 sats: stable throughout Complications: No apparent complications Patient did tolerate procedure well. Bilateral Breath Sounds: Clear   Yes   RT extubated patient to 4lnc. Vital signs stable throughout. No complications. Patient tolerated well. RN at bedside. RT will continue to monitor.  Ave Filterdkins, Taneesha Edgin Williams 09/22/2016, 9:49 AM

## 2016-09-22 NOTE — Progress Notes (Signed)
eLink Physician-Brief Progress Note Patient Name: Sarah Ellis DOB: 11/26/1980 MRN: [id number]   Date of Service  09/22/2016  HPI/Events of Note  Low potassium  eICU Interventions  replaced     Intervention Category Minor Interventions: Electrolytes abnormality - evaluation and management  Henry RusselSMITH, [name], P 09/22/2016, 6:34 PM

## 2016-09-22 NOTE — Progress Notes (Signed)
  Attempted to call significant other (per pt). No answer.

## 2016-09-22 NOTE — Progress Notes (Addendum)
[facility] Rounding Note  Subjective/Interval History:   4 hour HD yesterday for lithium overdose Lithium level post HD down to 0.93 but very close to end of HD (not allowing for re-equilibration) Excellent UOP Remains on bicarb drip and acetylcysteine Intubated  Objective Vital signs in last 24 hours: Vitals:   09/22/16 0700 09/22/16 0715 09/22/16 0720 09/22/16 0730  BP: (!) 100/52  (!) 100/52 110/61  Pulse: 97 99 (!) 101 (!) 104  Resp: _0 Temp:      TempSrc:      SpO2: 100% 100%  100%  Weight:      Height:       Weight change:   Intake/Output Summary (Last 24 hours) at 09/22/16 0744 Last data filed at 09/22/16 0732  Gross per 24 hour  Intake          5003.41 ml  Output             3585 ml  Net          1418.41 ml   Physical Exam:  Blood pressure 110/61, pulse (!) 104, temperature 100.2 F (37.9 C), temperature source Oral, resp. rate 11, height _1  (1.702 m), weight 67.9 kg (149 lb 11.1 oz), last menstrual period 09/13/2016, SpO2 100 %.  Intubated, quiet right now (probs with agitation during the night) Anteriorly clear S1S2 No S3 Abd soft No LE edema Clear yellow urine from foley  Recent Labs Lab 09/18/16 1954 09/21/16 0332 09/21/16 0931 09/21/16 1109 09/21/16 1504 09/21/16 2307 09/22/16 0532  NA 139 139 148* 140 135 140 139  K 5.2* 3.5 3.4* 3.2* 3.0* 2.6* 2.9*  CL 107 105 117* 107 101 104 106  CO2 20* _2 GLUCOSE 89 153* 77 86 90 82 97  BUN 16 12 5* <5* <5* <5* <5*  CREATININE 0.76 0.74 0.76 0.46 0.37* 0.69 0.74  CALCIUM 8.6* 8.7* 8.2* 7.6* 7.2* 7.9* 8.1*  PHOS  --   --   --   --   --   --  1.6*     Recent Labs Lab 09/18/16 1954 09/21/16 0332 09/21/16 0931  AST 40 21 26  ALT 10* 15 15  ALKPHOS 102 89 70  BILITOT 1.2 0.3 0.6  PROT 7.0 7.5 5.8*  ALBUMIN 4.3 4.3 3.6    Recent Labs Lab 09/18/16 1954  LIPASE 31    Recent Labs Lab 09/18/16 1954 09/21/16 0332 09/21/16 0931 09/22/16 0532  WBC 7.0 3.9* 7.4 6.0   NEUTROABS  --  2.2 6.4  --   HGB 13.9 13.5 11.8* 10.1*  HCT 41.2 40.2 36.1 31.0*  MCV 92.6 91.2 94.0 94.5  PLT 265 204 169 106*   ) Recent Labs Lab 09/21/16 0931 09/21/16 1504 09/21/16 2307  TROPONINI 0.03* 0.07* 0.03*     Recent Labs Lab 09/21/16 1208 09/21/16 1524 09/21/16 2012 09/22/16 0010 09/22/16 0343  GLUCAP 78 97 70 98 132*    Studies/Results: Dg Chest Portable 1 View  Result Date: 09/21/2016 CLINICAL DATA:  Intubation.  Hemo dialysis catheter placement. EXAM: PORTABLE CHEST 1 VIEW COMPARISON:  Three days ago FINDINGS: Endotracheal tube tip just below the clavicular heads. An orogastric tube tip overlaps the distal esophagus. Dialysis catheter on the right with tip in the region of the distal SVC. Mediastinal contours are distorted by leftward rotation. Normal heart size. No pneumothorax. No edema or consolidation. These results will be called to the ordering clinician or representative by the  Psychologist, clinical, and communication documented in the PACS or zVision Dashboard. IMPRESSION: 1. Short orogastric tube. Advancement by approximately 10 cm would place the side port in the stomach. 2. Unremarkable endotracheal tube. 3. Dialysis catheter overlaps the SVC. No pneumothorax or other acute cardiopulmonary finding. Electronically Signed   By: Monte Fantasia M.D.   On: 09/21/2016 09:57   Dg Abd Portable 1v  Result Date: 09/21/2016 CLINICAL DATA:  NG tube placement. EXAM: PORTABLE ABDOMEN - 1 VIEW COMPARISON:  Chest x-ray 09/18/2016. FINDINGS: Motion artifact noted . NG tube noted with tip over the distal stomach. No bowel distention or free air. No acute bony abnormality. IMPRESSION: NG tube noted with its tip projected over the stomach. Electronically Signed   By: [place]   On: 09/21/2016 17:12   Medications: . sodium chloride 125 mL/hr at 09/22/16 7939  . acetylcysteine 15 mg/kg/hr (09/22/16 0629)  . fentaNYL infusion INTRAVENOUS 50 mcg/hr (09/22/16  0730)  . propofol (DIPRIVAN) infusion Stopped (09/22/16 0732)  .  sodium bicarbonate  infusion 1000 mL 50 mL/hr at 09/22/16 0629   . chlorhexidine gluconate (MEDLINE KIT)  15 mL Mouth Rinse BID  . famotidine (PEPCID) IV  20 mg Intravenous Q12H  . folic acid  1 mg Intravenous Daily  . insulin aspart  0-9 Units Subcutaneous Q4H  . mouth rinse  15 mL Mouth Rinse QID  . meropenem (MERREM) IV  1 g Intravenous Q8H  . multivitamin with minerals  1 tablet Oral Daily  . thiamine  100 mg Oral Daily   Or  . thiamine  100 mg Intravenous Daily  . vancomycin  750 mg Intravenous Q8H    Assessment/Recommendations  1. Drug OD with Lithium  intoxication - s/p 4 hour HD 11/10. Post HD level 0.93 done too close to end of HD to account for re-equilibration of lithium (usu wait 4-6 hours). With excellent renal function, likely has excreted remainder, but would get another level this AM and if consistently down, can pull HD cath and renal will sign off. 2. Hypokalemia, hypophosphatemia - IV KPhos ordered (30 of K).   Jamal Maes, MD [facility] [phone] pager 09/22/2016, 7:44 AM   Addendum: Repeat Li this AM 1.63. Anticipate will have reasonable renal elimination from this point, but recheck level in 6 hours to assure trending down. Leave HD cath in for now.  Jamal Maes, MD [facility] [phone] Pager 09/22/2016, 9:42 AM

## 2016-09-22 NOTE — Progress Notes (Signed)
Pt was extubated. Calm, cooperative, follows commands. VS stable Suicide sitter at the bedside.

## 2016-09-22 NOTE — Progress Notes (Signed)
75 ml of Fentanyl was wasted into the sink. Bud Facerystal Manning, RN witnessed

## 2016-09-22 NOTE — Progress Notes (Addendum)
eLink Physician-Brief Progress Note Patient Name: Sarah Ellis DOB: 09/03/1981 MRN: [id number]   Date of Service  09/22/2016  HPI/Events of Note  Fever to 101.1 F. Had blood cultures X 2 yesterday at 9 AM. Not on Abx. Not making much in the way of sputum. Currently on Mucomyst for question of Tylenol ingestion. Creatinine = 0.69.  eICU Interventions  Will order: 1. UA now.  2. Vancomycin and Merrem per Pharmacy consult.  3. Motrin suspension 400 mg via tube Q 6 hours PRN Temp > 101.0 F.      Intervention Category Major Interventions: Infection - evaluation and management  Sommer,Steven Eugene 09/22/2016, 1:38 AM

## 2016-09-23 ENCOUNTER — Inpatient Hospital Stay (HOSPITAL_COMMUNITY): Payer: Self-pay

## 2016-09-23 DIAGNOSIS — E876 Hypokalemia: Secondary | ICD-10-CM

## 2016-09-23 DIAGNOSIS — T1491XA Suicide attempt, initial encounter: Secondary | ICD-10-CM

## 2016-09-23 DIAGNOSIS — T43012A Poisoning by tricyclic antidepressants, intentional self-harm, initial encounter: Secondary | ICD-10-CM

## 2016-09-23 DIAGNOSIS — F331 Major depressive disorder, recurrent, moderate: Secondary | ICD-10-CM | POA: Diagnosis not present

## 2016-09-23 DIAGNOSIS — Z811 Family history of alcohol abuse and dependence: Secondary | ICD-10-CM

## 2016-09-23 DIAGNOSIS — T56892A Toxic effect of other metals, intentional self-harm, initial encounter: Secondary | ICD-10-CM | POA: Diagnosis not present

## 2016-09-23 DIAGNOSIS — T424X2A Poisoning by benzodiazepines, intentional self-harm, initial encounter: Secondary | ICD-10-CM

## 2016-09-23 DIAGNOSIS — Z79899 Other long term (current) drug therapy: Secondary | ICD-10-CM

## 2016-09-23 DIAGNOSIS — F17210 Nicotine dependence, cigarettes, uncomplicated: Secondary | ICD-10-CM

## 2016-09-23 DIAGNOSIS — T421X2A Poisoning by iminostilbenes, intentional self-harm, initial encounter: Secondary | ICD-10-CM

## 2016-09-23 DIAGNOSIS — T405X2A Poisoning by cocaine, intentional self-harm, initial encounter: Secondary | ICD-10-CM

## 2016-09-23 DIAGNOSIS — T43592A Poisoning by other antipsychotics and neuroleptics, intentional self-harm, initial encounter: Principal | ICD-10-CM

## 2016-09-23 DIAGNOSIS — Z818 Family history of other mental and behavioral disorders: Secondary | ICD-10-CM

## 2016-09-23 DIAGNOSIS — Z794 Long term (current) use of insulin: Secondary | ICD-10-CM

## 2016-09-23 LAB — CBC WITH DIFFERENTIAL/PLATELET
Basophils Absolute: 0 10*3/uL (ref 0.0–0.1)
Basophils Relative: 0 %
EOS ABS: 0.2 10*3/uL (ref 0.0–0.7)
EOS PCT: 2 %
HCT: 28.9 % — ABNORMAL LOW (ref 36.0–46.0)
Hemoglobin: 9.5 g/dL — ABNORMAL LOW (ref 12.0–15.0)
LYMPHS ABS: 1.3 10*3/uL (ref 0.7–4.0)
Lymphocytes Relative: 18 %
MCH: 30.8 pg (ref 26.0–34.0)
MCHC: 32.9 g/dL (ref 30.0–36.0)
MCV: 93.8 fL (ref 78.0–100.0)
MONOS PCT: 10 %
Monocytes Absolute: 0.7 10*3/uL (ref 0.1–1.0)
Neutro Abs: 5 10*3/uL (ref 1.7–7.7)
Neutrophils Relative %: 71 %
PLATELETS: 98 10*3/uL — AB (ref 150–400)
RBC: 3.08 MIL/uL — ABNORMAL LOW (ref 3.87–5.11)
RDW: 12.5 % (ref 11.5–15.5)
WBC: 7.1 10*3/uL (ref 4.0–10.5)

## 2016-09-23 LAB — BASIC METABOLIC PANEL
Anion gap: 6 (ref 5–15)
Anion gap: 5 (ref 5–15)
Anion gap: 5 (ref 5–15)
Anion gap: 7 (ref 5–15)
CALCIUM: 8.3 mg/dL — AB (ref 8.9–10.3)
CHLORIDE: 107 mmol/L (ref 101–111)
CHLORIDE: 106 mmol/L (ref 101–111)
CO2: 27 mmol/L (ref 22–32)
CO2: 29 mmol/L (ref 22–32)
CO2: 28 mmol/L (ref 22–32)
CO2: 28 mmol/L (ref 22–32)
CREATININE: 0.66 mg/dL (ref 0.44–1.00)
CREATININE: 0.66 mg/dL (ref 0.44–1.00)
CREATININE: 0.65 mg/dL (ref 0.44–1.00)
CREATININE: 0.79 mg/dL (ref 0.44–1.00)
Calcium: 8.3 mg/dL — ABNORMAL LOW (ref 8.9–10.3)
Calcium: 8.1 mg/dL — ABNORMAL LOW (ref 8.9–10.3)
Calcium: 8.3 mg/dL — ABNORMAL LOW (ref 8.9–10.3)
Chloride: 105 mmol/L (ref 101–111)
Chloride: 103 mmol/L (ref 101–111)
GFR calc Af Amer: >60 mL/min (ref >60)
GFR calc Af Amer: >60 mL/min (ref >60)
GFR calc Af Amer: >60 mL/min (ref >60)
GFR calc non Af Amer: >60 mL/min (ref >60)
GFR calc non Af Amer: >60 mL/min (ref >60)
GLUCOSE: 128 mg/dL — AB (ref 65–99)
Glucose, Bld: 182 mg/dL — ABNORMAL HIGH (ref 65–99)
Glucose, Bld: 134 mg/dL — ABNORMAL HIGH (ref 65–99)
Glucose, Bld: 124 mg/dL — ABNORMAL HIGH (ref 65–99)
Potassium: 2.9 mmol/L — ABNORMAL LOW (ref 3.5–5.1)
Potassium: 2.6 mmol/L — CL (ref 3.5–5.1)
Potassium: 2.9 mmol/L — ABNORMAL LOW (ref 3.5–5.1)
Potassium: 3.5 mmol/L (ref 3.5–5.1)
SODIUM: 140 mmol/L (ref 135–145)
SODIUM: 138 mmol/L (ref 135–145)
SODIUM: 138 mmol/L (ref 135–145)
Sodium: 140 mmol/L (ref 135–145)

## 2016-09-23 LAB — GLUCOSE, CAPILLARY
GLUCOSE-CAPILLARY: 124 mg/dL — AB (ref 65–99)
GLUCOSE-CAPILLARY: 85 mg/dL (ref 65–99)
GLUCOSE-CAPILLARY: 95 mg/dL (ref 65–99)
Glucose-Capillary: 113 mg/dL — ABNORMAL HIGH (ref 65–99)
Glucose-Capillary: 118 mg/dL — ABNORMAL HIGH (ref 65–99)

## 2016-09-23 LAB — INFLUENZA PANEL BY PCR (TYPE A & B)
Influenza A By PCR: NEGATIVE
Influenza B By PCR: NEGATIVE

## 2016-09-23 MED ORDER — HYDROCOD POLST-CPM POLST ER 10-8 MG/5ML PO SUER
5.0000 mL | Freq: Two times a day (BID) | ORAL | Status: DC | PRN
  Administered 2016-09-23: 5 mL via ORAL
  Filled 2016-09-23: qty 5

## 2016-09-23 MED ORDER — ACETAMINOPHEN 325 MG PO TABS: 650.0000 mg | ORAL_TABLET | Freq: Four times a day (QID) | ORAL | Status: DC | PRN

## 2016-09-23 MED ORDER — POTASSIUM CHLORIDE 20 MEQ/15ML (10%) PO SOLN
40.0000 meq | Freq: Once | ORAL | Status: AC
  Administered 2016-09-23: 40 meq via ORAL
  Filled 2016-09-23: qty 30

## 2016-09-23 MED ORDER — POTASSIUM CHLORIDE 10 MEQ/50ML IV SOLN
10.0000 meq | INTRAVENOUS | Status: AC
  Administered 2016-09-23 (×4): 10 meq via INTRAVENOUS
  Filled 2016-09-23 (×3): qty 50

## 2016-09-23 NOTE — Progress Notes (Signed)
eLink Physician-Brief Progress Note Patient Name: Sarah Ellis DOB: 02/04/1981 MRN: [id number]   Date of Service  09/23/2016  HPI/Events of Note  K 2.6  eICU Interventions  40 IV     Intervention Category Major Interventions: Electrolyte abnormality - evaluation and management  [name] 09/23/2016, 5:58 AM
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
## 2016-09-23 NOTE — Progress Notes (Signed)
Modified Barium Swallow Progress Note  Patient Details  Name: Sarah Ellis MRN: [id number] Date of Birth: 04/22/1981  Today's Date: 09/23/2016  Modified Barium Swallow completed.  Full report located under Chart Review in the Imaging Section.  Brief recommendations include the following:  Clinical Impression  Pt's oropharyngeal swallow is WFL. Immediate coughing still noted after most swallows, but it does not correlate with any airway compromise. She reports a globus sensation as well, which may be related to h/o GERD. Recommend a regular diet and thin liquids. No further SLP f/u needed.   Swallow Evaluation Recommendations       SLP Diet Recommendations: Regular solids;Thin liquid   Liquid Administration via: Cup;Straw   Medication Administration: Whole meds with liquid   Supervision: Patient able to self feed;Intermittent supervision to cue for compensatory strategies   Compensations: Slow rate;Small sips/bites   Postural Changes: Seated upright at 90 degrees;Remain semi-upright after after feeds/meals (Comment)   Oral Care Recommendations: Oral care BID   Other Recommendations: Have oral suction available    Maxcine Hamaiewonsky, Atha Muradyan 09/23/2016,12:58 PM   Maxcine HamLaura Paiewonsky, M.A. CCC-SLP [phone]

## 2016-09-23 NOTE — Progress Notes (Addendum)
PULMONARY / CRITICAL CARE MEDICINE   Name: Sarah Ellis MRN: [id number] DOB: 08/31/1981    ADMISSION DATE:  09/21/2016 CONSULTATION DATE:  11/10  REFERRING MD:  [facility]  CHIEF COMPLAINT:  OD/AMS  BRIEF: 35 y/o female with polysubstance abuse admitted on 11/10 with Li Overdose and APAP overdose.   SOCIAL HISTORY: She  reports that she has been smoking Cigarettes.  She has been smoking about 0.50 packs per day. She has never used smokeless tobacco. She reports that she drinks alcohol. She reports that she uses drugs, including Cocaine.  REVIEW OF SYSTEMS:   Cough, body aches, sore throate  SUBJECTIVE:  Extubated yesterday Cough this morning   VITAL SIGNS: BP 90/70   Pulse 75   Temp 99.3 F (37.4 C) (Oral)   Resp 15   Ht 5\' 7"  (1.702 m)   Wt 67.9 kg (149 lb 11.1 oz)   LMP 09/13/2016 (Exact Date)   SpO2 97%   BMI 23.45 kg/m   HEMODYNAMICS:    VENTILATOR SETTINGS:    INTAKE / OUTPUT: I/O last 3 completed shifts: In: 8247.8 [P.O.:180; I.V.:6846; IV Piggyback:1221.8] Out: 6500 [Urine:6500]  PHYSICAL EXAMINATION: General: resting in bed, no distress HENT: NCAT, OP clear PULM: CTA B, cough with hoarseness CV: RRR, no mgr GI: BS+, soft, nontender MSK: normal bulk and tone Neuro: awake, conversant  LABS:  BMET  Recent Labs Lab 09/22/16 1720 09/22/16 1721 09/23/16 0520  NA 140 140 140  K 2.5* 2.5* 2.6*  CL 104 104 106  CO2 31 31 29   BUN <5* <5* <5*  CREATININE 0.69 0.69 0.66  GLUCOSE 152* 152* 182*    Electrolytes  Recent Labs Lab 09/22/16 0532  09/22/16 1720 09/22/16 1721 09/23/16 0520  CALCIUM 8.1*  < > 8.0* 8.0* 8.3*  MG 1.7  --   --   --   --   PHOS 1.6*  --  4.2  --   --   < > = values in this interval not displayed.  CBC  Recent Labs Lab 09/21/16 0931 09/22/16 0532 09/23/16 0520  WBC 7.4 6.0 7.1  HGB 11.8* 10.1* 9.5*  HCT 36.1 31.0* 28.9*  PLT 169 106* 98*    Coag's  Recent Labs Lab 09/22/16 0532  APTT 28  INR 1.16     Sepsis Markers No results for input(s): LATICACIDVEN, PROCALCITON, O2SATVEN in the last 168 hours.  ABG  Recent Labs Lab 09/21/16 0926 09/21/16 1227  PHART 7.543* 7.455*  PCO2ART 35.8 40.9  PO2ART 351.0* 153.0*    Liver Enzymes  Recent Labs Lab 09/21/16 0332 09/21/16 0931 09/22/16 1118 09/22/16 1720  AST 21 26 20   --   ALT 15 15 15   --   ALKPHOS 89 70 71  --   BILITOT 0.3 0.6 0.7  --   ALBUMIN 4.3 3.6 2.6* 2.3*    Cardiac Enzymes  Recent Labs Lab 09/21/16 0931 09/21/16 1504 09/21/16 2307  TROPONINI 0.03* 0.07* 0.03*    Glucose  Recent Labs Lab 09/22/16 0343 09/22/16 0751 09/22/16 1154 09/22/16 1549 09/22/16 1938 09/22/16 2319  GLUCAP 132* 78 123* 131* 121* 124*    Imaging No results found.   STUDIES:    CULTURES: 11/10 blood >   ANTIBIOTICS: vanc 11/10 > 11/11 mero 11/10 > 11/11  SIGNIFICANT EVENTS: 11/10 OD, emergent HD, ventilator  LINES/TUBES: ETT 11/10 > 11/11  DISCUSSION: 35 yo bipolar admitted 11/10 with polysubstance OD(tylenol/lithium) admitted for APAP and Li overdose required emergent hemodialysis.  ASSESSMENT / PLAN:  PULMONARY A: Tobacco abuse Cocaine abuse Cough, likely mild URI Acute respiratory failure with hypoxemia> intubated for airway protection P:   SLP eval post extubation > Modified barium swallow today tussionex prn  CARDIOVASCULAR A:  No acute issues P:  Tele monitoring Monitor Hemodynamics  RENAL A:   Lithium toxicity but no evidence of kidney injury Hypokalemia P:   IV and oral potassium Remove HD cath Monitor BMET and UOP Replace electrolytes as needed Renal appreciated  GASTROINTESTINAL A:   GI protection GERD? > hoarseness P:   PPI daily  SLP eval > MBS today  HEMATOLOGIC A:   Tylenol overdose without evidence of liver injury > LFT normal P:  Mucomyst per pharm > complete protocol  INFECTIOUS A:   Fever > suspect viral URI P:   Stop antibiotics Check flu  panel  ENDOCRINE A:   No acute issue  P:   Follow glucose  NEUROLOGIC A:   Acute encephalopathy in setting of lithium overdose > resolved Suicide attempt Polysubstance abuse At risk for EtOH withdrawal P:   Monitor for sign of withdrawal Thiamine and folic acid Suicide precautions Psyche consult   FAMILY  - Updates:  Boyfriend updated bedside 09/23/2016  - Inter-disciplinary family meet or Palliative Care meeting due by: 09/28/16  To med-surg, [facility]  Heber CarolinaBrent Reyne Falconi, MD [place] [facility] Pager: [phone] Cell: [phone] After 3pm or if no response, call [phone]  09/23/2016 11:07 AM
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
## 2016-09-23 NOTE — Progress Notes (Signed)
Pt transferred from [facility]M this evening. Alert and oriented,medicated for pain as ordered. Suicide sitter in room with pt.

## 2016-09-24 ENCOUNTER — Inpatient Hospital Stay (HOSPITAL_COMMUNITY): Payer: Self-pay

## 2016-09-24 LAB — GLUCOSE, CAPILLARY
GLUCOSE-CAPILLARY: 150 mg/dL — AB (ref 65–99)
GLUCOSE-CAPILLARY: 103 mg/dL — AB (ref 65–99)
Glucose-Capillary: 148 mg/dL — ABNORMAL HIGH (ref 65–99)
Glucose-Capillary: 131 mg/dL — ABNORMAL HIGH (ref 65–99)
Glucose-Capillary: 110 mg/dL — ABNORMAL HIGH (ref 65–99)
Glucose-Capillary: 107 mg/dL — ABNORMAL HIGH (ref 65–99)

## 2016-09-24 MED ORDER — FAMOTIDINE 20 MG PO TABS
20.0000 mg | ORAL_TABLET | Freq: Two times a day (BID) | ORAL | Status: DC
  Administered 2016-09-24 – 2016-09-26 (×5): 20 mg via ORAL
  Filled 2016-09-24 (×5): qty 1

## 2016-09-24 MED ORDER — GUAIFENESIN-DM 100-10 MG/5ML PO SYRP
5.0000 mL | ORAL_SOLUTION | ORAL | Status: DC | PRN
  Administered 2016-09-24 – 2016-09-25 (×2): 5 mL via ORAL
  Filled 2016-09-24 (×2): qty 5

## 2016-09-24 MED ORDER — ENSURE ENLIVE PO LIQD
237.0000 mL | Freq: Two times a day (BID) | ORAL | Status: DC
  Administered 2016-09-24 – 2016-09-25 (×2): 237 mL via ORAL

## 2016-09-24 MED ORDER — FOLIC ACID 1 MG PO TABS
1.0000 mg | ORAL_TABLET | Freq: Every day | ORAL | Status: DC
  Administered 2016-09-24 – 2016-09-26 (×3): 1 mg via ORAL
  Filled 2016-09-24 (×3): qty 1

## 2016-09-24 MED ORDER — HYDROCODONE-ACETAMINOPHEN 5-325 MG PO TABS
1.0000 | ORAL_TABLET | Freq: Four times a day (QID) | ORAL | Status: DC | PRN
Start: 2016-09-24 — End: 2016-09-25
  Administered 2016-09-24 – 2016-09-25 (×2): 1 via ORAL
  Filled 2016-09-24 (×2): qty 1

## 2016-09-24 NOTE — Progress Notes (Signed)
Nutrition Follow-up  DOCUMENTATION CODES:   Not applicable  INTERVENTION:   -Ensure Enlive po BID, each supplement provides 350 kcal and 20 grams of protein  NUTRITION DIAGNOSIS:   Inadequate oral intake related to poor appetite as evidenced by meal completion < 50%.  Ongoing  GOAL:   Patient will meet greater than or equal to 90% of their needs  Progressing  MONITOR:   PO intake, Supplement acceptance, Labs, Weight trends, Skin, I & O's  REASON FOR ASSESSMENT:   Ventilator    ASSESSMENT:   35 yo female with hx of bipolar D/O, [facility] with polysubstance OD (tylenol/lithium). Required intubation on admission.  Pt extubated on 09/22/16. Pt transferred from ICU to surgical floor on 09/23/16.   Per nephrology notes, last HD on 09/22/16 with no further indications for HD at this time.   Pt receiving nursing care at time of visit. Spoke with sitter, who reports intake has been poor secondary to pt complaining of stomach pain. Meal completion 25%. RD will order nutritional supplements to optimize intake.   Per MD notes, awaiting inpatient psychiatric placement.   Labs reviewed: CBGS: 103-131.   Diet Order:  Diet regular Room service appropriate? Yes; Fluid consistency: Thin  Skin:  Reviewed, no issues  Last BM:  09/21/16  Height:   Ht Readings from Last 1 Encounters:  09/21/16 5\' 7"  (1.702 m)    Weight:   Wt Readings from Last 1 Encounters:  09/21/16 149 lb 11.1 oz (67.9 kg)    Ideal Body Weight:  61.4 kg  BMI:  Body mass index is 23.45 kg/m.  Estimated Nutritional Needs:   Kcal:  1600-1800  Protein:  80-95 grams  Fluid:  1.6-1.8 L  EDUCATION NEEDS:   No education needs identified at this time  Abimbola Aki A. Mayford KnifeWilliams, RD, LDN, CDE Pager: [phone] After hours Pager: [phone]

## 2016-09-24 NOTE — Progress Notes (Signed)
Second message sent to Dr Izola PriceMyers today, inquiring if CBGs q4hr can be stopped, pt not diabetic, blood sugars staying at 100-110 range. Pt has dry cough and asking for a med that may help, and pt is still receiving IV fentanyl q2hr prn, pt ask for every 2 hours around the clock, even at night, pt not sleeping, c/o generalized pain as her c/o pain no specific spot hurting.  Pt may benefit from oral med instead of IV fentanyl.  Asking MD to advise.   Orders placed. CBG checks stopped, added PO Vicodin as needed, robitussin as needed, no other narcotics allowed.   Debbora PrestoMAGICK-Inell Mimbs, MD  [facility] Pager [phone]  If 7PM-7AM, please contact night-coverage [email or web address] Password [email or web address]

## 2016-09-24 NOTE — Progress Notes (Addendum)
Error
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
## 2016-09-24 NOTE — Progress Notes (Signed)
Pt denies any suicidal thoughts to harm self or others.

## 2016-09-25 LAB — GLUCOSE, CAPILLARY: Glucose-Capillary: 85 mg/dL (ref 65–99)

## 2016-09-25 MED ORDER — LEVOFLOXACIN 750 MG PO TABS
750.0000 mg | ORAL_TABLET | Freq: Every day | ORAL | Status: DC
  Administered 2016-09-25 – 2016-09-26 (×2): 750 mg via ORAL
  Filled 2016-09-25 (×2): qty 1

## 2016-09-25 MED ORDER — LEVOFLOXACIN 750 MG PO TABS: 750.0000 mg | ORAL_TABLET | Freq: Every day | ORAL | 0 refills | Status: AC

## 2016-09-25 MED ORDER — HYDROCODONE-ACETAMINOPHEN 5-325 MG PO TABS
1.0000 | ORAL_TABLET | ORAL | Status: DC | PRN
Start: 2016-09-25 — End: 2016-09-26
  Administered 2016-09-25 – 2016-09-26 (×8): 2 via ORAL
  Filled 2016-09-25 (×8): qty 2

## 2016-09-25 MED ORDER — HYDROCOD POLST-CPM POLST ER 10-8 MG/5ML PO SUER
5.0000 mL | Freq: Two times a day (BID) | ORAL | Status: AC | PRN
  Administered 2016-09-25 – 2016-09-26 (×2): 5 mL via ORAL
  Filled 2016-09-25 (×2): qty 5

## 2016-09-25 MED ORDER — HYDROCOD POLST-CPM POLST ER 10-8 MG/5ML PO SUER
5.0000 mL | Freq: Once | ORAL | Status: AC
Start: 2016-09-25 — End: 2016-09-25
  Administered 2016-09-25: 5 mL via ORAL
  Filled 2016-09-25: qty 5

## 2016-09-25 MED ORDER — INFLUENZA VAC SPLIT QUAD 0.5 ML IM SUSY
0.5000 mL | PREFILLED_SYRINGE | INTRAMUSCULAR | Status: AC
  Administered 2016-09-26: 0.5 mL via INTRAMUSCULAR
  Filled 2016-09-25: qty 0.5

## 2016-09-25 MED ORDER — ADULT MULTIVITAMIN W/MINERALS CH
1.0000 | ORAL_TABLET | ORAL | Status: DC
  Administered 2016-09-25 – 2016-09-26 (×2): 1 via ORAL
  Filled 2016-09-25 (×2): qty 1

## 2016-09-25 MED ORDER — DOCUSATE SODIUM 100 MG PO CAPS
100.0000 mg | ORAL_CAPSULE | Freq: Two times a day (BID) | ORAL | Status: DC
  Administered 2016-09-25 – 2016-09-26 (×2): 100 mg via ORAL
  Filled 2016-09-25 (×2): qty 1
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
## 2016-09-26 ENCOUNTER — Encounter (HOSPITAL_COMMUNITY): Payer: Self-pay | Admitting: General Practice

## 2016-09-26 DIAGNOSIS — F411 Generalized anxiety disorder: Secondary | ICD-10-CM

## 2016-09-26 DIAGNOSIS — T56892A Toxic effect of other metals, intentional self-harm, initial encounter: Secondary | ICD-10-CM | POA: Diagnosis not present

## 2016-09-26 DIAGNOSIS — Z888 Allergy status to other drugs, medicaments and biological substances status: Secondary | ICD-10-CM

## 2016-09-26 DIAGNOSIS — J9601 Acute respiratory failure with hypoxia: Secondary | ICD-10-CM

## 2016-09-26 DIAGNOSIS — T43012A Poisoning by tricyclic antidepressants, intentional self-harm, initial encounter: Secondary | ICD-10-CM | POA: Diagnosis not present

## 2016-09-26 DIAGNOSIS — F331 Major depressive disorder, recurrent, moderate: Secondary | ICD-10-CM | POA: Diagnosis not present

## 2016-09-26 LAB — GLUCOSE, CAPILLARY
Glucose-Capillary: 106 mg/dL — ABNORMAL HIGH (ref 65–99)
Glucose-Capillary: 68 mg/dL (ref 65–99)

## 2016-09-26 LAB — CULTURE, BLOOD (ROUTINE X 2)
CULTURE: NO GROWTH
CULTURE: NO GROWTH

## 2016-09-26 MED ORDER — HYDROCODONE-CHLORPHENIRAMINE 5-4 MG/5ML PO SOLN: 5.0000 mL | Freq: Two times a day (BID) | ORAL | 0 refills | Status: AC | PRN

## 2016-09-26 MED ORDER — POLYETHYLENE GLYCOL 3350 17 G PO PACK: 17.0000 g | PACK | Freq: Every day | ORAL | Status: DC

## 2016-09-26 NOTE — Progress Notes (Signed)
   09/26/16 0900  Clinical Encounter Type  Visited With Patient  Visit Type Initial;Psychological support;Spiritual support  Referral From Nurse  Consult/Referral To Chaplain  Spiritual Encounters  Spiritual Needs Emotional  Stress Factors  Patient Stress Factors None identified    Chaplain responded to referral to for pastoral care regarding pt post attempted suicide. Pt downplayed that she wanted to harm herself, but had flat affect and indicated that she has "no real desire to live" in this world. Chaplain spent some time talking about future hopes and dreams with pt and pt said that she did hope to eventually go back to work. Pt talked about previous drug use and said that she wanted a better life for herself.
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
## 2016-09-26 NOTE — Discharge Instructions (Addendum)
Sarah Ellis, you were admitted because of a lithium overdose. You were evaluated by psychiatry and monitored for lithium toxicity. Upon reevaluation, psychiatry believes you are safe for discharge home with [facility]. You were also found to have a pneumonia. You are being treated with antibiotics and medication to help with your cough.  Acute Respiratory Failure, Adult Acute respiratory failure occurs when there is not enough oxygen passing from your lungs to your body. When this happens, your lungs have trouble removing carbon dioxide from the blood. This causes your blood oxygen level to drop too low as carbon dioxide builds up. Acute respiratory failure is a medical emergency. It can develop quickly, but it is temporary if treated promptly. Your lung capacity, or how much air your lungs can hold, may improve with time, exercise, and treatment. What are the causes? There are many possible causes of acute respiratory failure, including:  Lung injury.  Chest injury or damage to the ribs or tissues near the lungs.  Lung conditions that affect the flow of air and blood into and out of the lungs, such as pneumonia, acute respiratory distress syndrome, and cystic fibrosis.  Medical conditions, such as strokes or spinal cord injuries, that affect the muscles and nerves that control breathing.  Blood infection (sepsis).  Inflammation of the pancreas (pancreatitis).  A blood clot in the lungs (pulmonary embolism).  A large-volume blood transfusion.  Burns.  Near-drowning.  Seizure.  Smoke inhalation.  Reaction to medicines.  Alcohol or drug overdose. What increases the risk? This condition is more likely to develop in people who have:  A blocked airway.  Asthma.  A condition or disease that damages or weakens the muscles, nerves, bones, or tissues that are involved in breathing.  A serious infection.  A health problem that blocks the unconscious reflex  that is involved in breathing, such as hypothyroidism or sleep apnea.  A lung injury or trauma. What are the signs or symptoms? Trouble breathing is the main symptom of acute respiratory failure. Symptoms may also include:  Rapid breathing.  Restlessness or anxiety.  Skin, lips, or fingernails that appear blue (cyanosis).  Rapid heart rate.  Abnormal heart rhythms (arrhythmias).  Confusion or changes in behavior.  Tiredness or loss of energy.  Feeling sleepy or having a loss of consciousness. How is this diagnosed? Your health care provider can diagnose acute respiratory failure with a medical history and physical exam. During the exam, your health care provider will listen to your heart and check for crackling or wheezing sounds in your lungs. Your may also have tests to confirm the diagnosis and determine what is causing respiratory failure. These tests may include:  Measuring the amount of oxygen in your blood (pulse oximetry). The measurement comes from a small device that is placed on your finger, earlobe, or toe.  Other blood tests to measure blood gases and to look for signs of infection.  Sampling your cerebral spinal fluid or tracheal fluid to check for infections.  Chest X-ray to look for fluid in spaces that should be filled with air.  Electrocardiogram (ECG) to look at the heart's electrical activity. How is this treated? Treatment for this condition usually takes places in a hospital intensive care unit (ICU). Treatment depends on what is causing the condition. It may include one or more treatments until your symptoms improve. Treatment may include:  Supplemental oxygen. Extra oxygen is given through a tube in the nose, a face mask, or a hood.  A device such as a continuous positive airway pressure (CPAP) or bi-level positive airway pressure (BiPAP or BPAP) machine. This treatment uses mild air pressure to keep the airways open. A mask or other device will be placed  over your nose or mouth. A tube that is connected to a motor will deliver oxygen through the mask.  Ventilator. This treatment helps move air into and out of the lungs. This may be done with a bag and mask or a machine. For this treatment, a tube is placed in your windpipe (trachea) so air and oxygen can flow to the lungs.  Extracorporeal membrane oxygenation (ECMO). This treatment temporarily takes over the function of the heart and lungs, supplying oxygen and removing carbon dioxide. ECMO gives the lungs a chance to recover. It may be used if a ventilator is not effective.  Tracheostomy. This is a procedure that creates a hole in the neck to insert a breathing tube.  Receiving fluids and medicines.  Rocking the bed to help breathing. Follow these instructions at home:  Take over-the-counter and prescription medicines only as told by your health care provider.  Return to normal activities as told by your health care provider. Ask your health care provider what activities are safe for you.  Keep all follow-up visits as told by your health care provider. This is important. How is this prevented? Treating infections and medical conditions that may lead to acute respiratory failure can help prevent the condition from developing. Contact a health care provider if:  You have a fever.  Your symptoms do not improve or they get worse. Get help right away if:  You are having trouble breathing.  You lose consciousness.  Your have cyanosis or turn blue.  You develop a rapid heart rate.  You are confused. These symptoms may represent a serious problem that is an emergency. Do not wait to see if the symptoms will go away. Get medical help right away. Call your local emergency services (911 in the [place] Do not drive yourself to the hospital.  This information is not intended to replace advice given to you by your health care provider. Make sure you discuss any questions you have with your  health care provider. Document Released: 11/03/2013 Document Revised: 05/26/2016 Document Reviewed: 05/16/2016 Elsevier Interactive Patient Education  2017 [employer]

## 2016-09-26 NOTE — Progress Notes (Signed)
Hypoglycemic Event  CBG: 68  Treatment: 15 GM carbohydrate snack  Symptoms: Shaky  Follow-up CBG: Time:0727 CBG Result:106  Possible Reasons for Event: Unknown  Comments/MD notified    Montel Clockuliao, Zalma Channing Lucille C

## 2016-09-26 NOTE — Progress Notes (Signed)
Pt dc'd home at this time.  Pt denies any thoughts of harming self.  No s/s of any acute distress noted.  Accompanied by bf.

## 2017-01-01 ENCOUNTER — Encounter (HOSPITAL_COMMUNITY): Payer: Self-pay | Admitting: Emergency Medicine

## 2017-01-01 ENCOUNTER — Emergency Department (HOSPITAL_COMMUNITY)
Admission: EM | Admit: 2017-01-01 | Discharge: 2017-01-01 | Disposition: A | Discharge status: Home / Self Care | Payer: Self-pay | Attending: Emergency Medicine | Admitting: Emergency Medicine

## 2017-01-01 DIAGNOSIS — F17210 Nicotine dependence, cigarettes, uncomplicated: Secondary | ICD-10-CM | POA: Insufficient documentation

## 2017-01-01 DIAGNOSIS — F1010 Alcohol abuse, uncomplicated: Secondary | ICD-10-CM | POA: Insufficient documentation

## 2017-01-01 DIAGNOSIS — Z79899 Other long term (current) drug therapy: Secondary | ICD-10-CM | POA: Insufficient documentation

## 2017-01-01 DIAGNOSIS — F1110 Opioid abuse, uncomplicated: Secondary | ICD-10-CM | POA: Insufficient documentation

## 2017-01-01 LAB — I-STAT BETA HCG BLOOD, ED (MC, WL, AP ONLY): I-stat hCG, quantitative: <5 m[IU]/mL (ref <5)

## 2017-01-01 LAB — COMPREHENSIVE METABOLIC PANEL
ALK PHOS: 95 U/L (ref 38–126)
ALT: 14 U/L (ref 14–54)
AST: 18 U/L (ref 15–41)
Albumin: 4.2 g/dL (ref 3.5–5.0)
Anion gap: 8 (ref 5–15)
BUN: 9 mg/dL (ref 6–20)
CALCIUM: 8.8 mg/dL — AB (ref 8.9–10.3)
CHLORIDE: 106 mmol/L (ref 101–111)
CO2: 21 mmol/L — AB (ref 22–32)
CREATININE: 0.68 mg/dL (ref 0.44–1.00)
GFR calc non Af Amer: >60 mL/min (ref >60)
Glucose, Bld: 104 mg/dL — ABNORMAL HIGH (ref 65–99)
Potassium: 4.2 mmol/L (ref 3.5–5.1)
SODIUM: 135 mmol/L (ref 135–145)
Total Bilirubin: 0.4 mg/dL (ref 0.3–1.2)
Total Protein: 7.3 g/dL (ref 6.5–8.1)

## 2017-01-01 LAB — CBC
HCT: 42.3 % (ref 36.0–46.0)
Hemoglobin: 14.5 g/dL (ref 12.0–15.0)
MCH: 30.7 pg (ref 26.0–34.0)
MCHC: 34.3 g/dL (ref 30.0–36.0)
MCV: 89.6 fL (ref 78.0–100.0)
PLATELETS: 341 10*3/uL (ref 150–400)
RBC: 4.72 MIL/uL (ref 3.87–5.11)
RDW: 12.5 % (ref 11.5–15.5)
WBC: 11.3 10*3/uL — ABNORMAL HIGH (ref 4.0–10.5)

## 2017-01-01 LAB — URINALYSIS, ROUTINE W REFLEX MICROSCOPIC
Bacteria, UA: NONE SEEN
Bilirubin Urine: NEGATIVE
GLUCOSE, UA: NEGATIVE mg/dL
Hgb urine dipstick: NEGATIVE
KETONES UR: NEGATIVE mg/dL
Nitrite: NEGATIVE
PH: 5 (ref 5.0–8.0)
Protein, ur: NEGATIVE mg/dL
SPECIFIC GRAVITY, URINE: 1.006 (ref 1.005–1.030)

## 2017-01-01 LAB — RAPID URINE DRUG SCREEN, HOSP PERFORMED
AMPHETAMINES: NOT DETECTED
BENZODIAZEPINES: NOT DETECTED
Barbiturates: NOT DETECTED
COCAINE: NOT DETECTED
OPIATES: NOT DETECTED
Tetrahydrocannabinol: NOT DETECTED

## 2017-01-01 LAB — ETHANOL: ALCOHOL ETHYL (B): 263 mg/dL — AB (ref <5)

## 2017-01-01 LAB — LIPASE, BLOOD: Lipase: 34 U/L (ref 11–51)

## 2017-01-01 MED ORDER — PANTOPRAZOLE SODIUM 40 MG IV SOLR
40.0000 mg | Freq: Once | INTRAVENOUS | Status: AC
  Administered 2017-01-01: 40 mg via INTRAVENOUS
  Filled 2017-01-01: qty 40

## 2017-01-01 MED ORDER — ONDANSETRON HCL 4 MG/2ML IJ SOLN
4.0000 mg | Freq: Once | INTRAMUSCULAR | Status: AC
Start: 2017-01-01 — End: 2017-01-01
  Administered 2017-01-01: 4 mg via INTRAVENOUS
  Filled 2017-01-01: qty 2

## 2017-01-01 MED ORDER — CHLORDIAZEPOXIDE HCL 25 MG PO CAPS: ORAL_CAPSULE | ORAL | 0 refills | Status: AC

## 2017-01-01 MED ORDER — SODIUM CHLORIDE 0.9 % IV BOLUS (SEPSIS)
1000.0000 mL | Freq: Once | INTRAVENOUS | Status: AC
  Administered 2017-01-01: 1000 mL via INTRAVENOUS

## 2017-01-01 MED ORDER — LORAZEPAM 2 MG/ML IJ SOLN: 1.0000 mg | Freq: Once | INTRAMUSCULAR | Status: DC

## 2017-01-01 MED ORDER — ONDANSETRON 4 MG PO TBDP
4.0000 mg | ORAL_TABLET | Freq: Once | ORAL | Status: AC | PRN
  Administered 2017-01-01: 4 mg via ORAL
  Filled 2017-01-01: qty 1

## 2017-01-01 MED ORDER — ONDANSETRON 4 MG PO TBDP: ORAL_TABLET | ORAL | 0 refills | Status: AC

## 2017-01-01 MED ORDER — ACETAMINOPHEN 500 MG PO TABS
1000.0000 mg | ORAL_TABLET | Freq: Once | ORAL | Status: AC
  Administered 2017-01-01: 1000 mg via ORAL
  Filled 2017-01-01: qty 2

## 2017-01-01 MED ORDER — NALOXONE HCL 4 MG/0.1ML NA LIQD
1.0000 | Freq: Once | NASAL | Status: AC
  Administered 2017-01-01: 1 via NASAL
  Filled 2017-01-01: qty 4

## 2017-01-01 NOTE — ED Notes (Signed)
Bed: WA23 Expected date:  Expected time:  Means of arrival:  Comments: Triage 4 
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
## 2017-01-01 NOTE — ED Notes (Signed)
Bed: [facility] Expected date:  Expected time:  Means of arrival:  Comments: 

## 2017-01-01 NOTE — Progress Notes (Signed)
CSW met with pt and provided pt with names and addresses of [facility] [place] a home for people in recovery from alcohol and/or other substances.  CSW also provided pt with address, phone number of [facility] and the walk-in hours for intake and assessment for Intensive Outpatient Services for recovery from alcohol and/or other substances.  CSW also provided pt with meeting schedules for Alcoholics Anonymous and Narcotics Anonymous in the [place] area.  Pt was pleasant and appreciated CSW's efforts and asked to D/C immediately.  Pt was provided with bus passes.  Alphonse Guild. Antero Derosia, Latanya Presser, LCAS Clinical Social Worker Ph: [phone]    '

## 2017-01-01 NOTE — ED Triage Notes (Addendum)
Patient comes from home for abd pain, vomiting bright red blood and having withdrawals from ETOH and heroin. Patient last use of heroin was on Sunday and reports to EMS that she drank beer today but vomited.  Patient reports to EMS that she had syncopal episode on Sunday and fell and chipped a tooth.

## 2017-01-01 NOTE — ED Notes (Signed)
Pt reports nausea medication helping. Asking for something to eat and drink

## 2017-01-01 NOTE — ED Notes (Signed)
Pt ambulated to restroom. Gave pt sprite and crackers.

## 2017-01-01 NOTE — ED Provider Notes (Signed)
Patient left without paper prescriptions that were not attached to her paperwork. Unable to get in touch with her after leaving. She was prescribed zofran odt 4mg  x4 tabs and librium taper.   Lyndal Pulleyaniel Ruthel Martine, MD 01/01/17 2021

## 2017-04-09 ENCOUNTER — Emergency Department (HOSPITAL_COMMUNITY)
Admission: EM | Admit: 2017-04-09 | Discharge: 2017-04-10 | Disposition: A | Discharge status: Home / Self Care | Payer: Self-pay | Attending: Emergency Medicine | Admitting: Emergency Medicine

## 2017-04-09 DIAGNOSIS — F17210 Nicotine dependence, cigarettes, uncomplicated: Secondary | ICD-10-CM | POA: Insufficient documentation

## 2017-04-09 DIAGNOSIS — L509 Urticaria, unspecified: Secondary | ICD-10-CM | POA: Insufficient documentation

## 2017-04-09 MED ORDER — FAMOTIDINE IN NACL 20-0.9 MG/50ML-% IV SOLN
20.0000 mg | Freq: Once | INTRAVENOUS | Status: AC
  Administered 2017-04-09: 20 mg via INTRAVENOUS
  Filled 2017-04-09: qty 50

## 2017-04-09 MED ORDER — LORATADINE 10 MG PO TABS
10.0000 mg | ORAL_TABLET | Freq: Once | ORAL | Status: AC
  Administered 2017-04-09: 10 mg via ORAL
  Filled 2017-04-09: qty 1

## 2017-04-09 MED ORDER — METHYLPREDNISOLONE SODIUM SUCC 125 MG IJ SOLR
125.0000 mg | Freq: Once | INTRAMUSCULAR | Status: AC
  Administered 2017-04-09: 125 mg via INTRAVENOUS
  Filled 2017-04-09: qty 2

## 2017-04-09 MED ORDER — SODIUM CHLORIDE 0.9 % IV BOLUS (SEPSIS)
1000.0000 mL | Freq: Once | INTRAVENOUS | Status: AC
  Administered 2017-04-09: 1000 mL via INTRAVENOUS

## 2017-04-09 NOTE — ED Triage Notes (Signed)
Pt c/o allergic reaction for about 30 minutes, Hives all over body. States she feels it's hard to breathe. Pt anxious in triage.
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
## 2017-04-10 MED ORDER — PREDNISONE 20 MG PO TABS: 40.0000 mg | ORAL_TABLET | Freq: Every day | ORAL | 0 refills | Status: AC

## 2017-04-10 NOTE — ED Notes (Signed)
Rash to face arms and torso greatly reduced pt states she is feeling much better and itching has nearly gone.

## 2017-08-04 IMAGING — CR DG RIBS W/ CHEST 3+V*R*
4 series · 4 of 4 positions shown · non-contrast
Comparison: None.

CLINICAL DATA: Cough for 1 week. Right chest wall and rib pain for
1 week. Current smoker.

EXAM:
RIGHT RIBS AND CHEST - 3+ VIEW

[w chest pa]
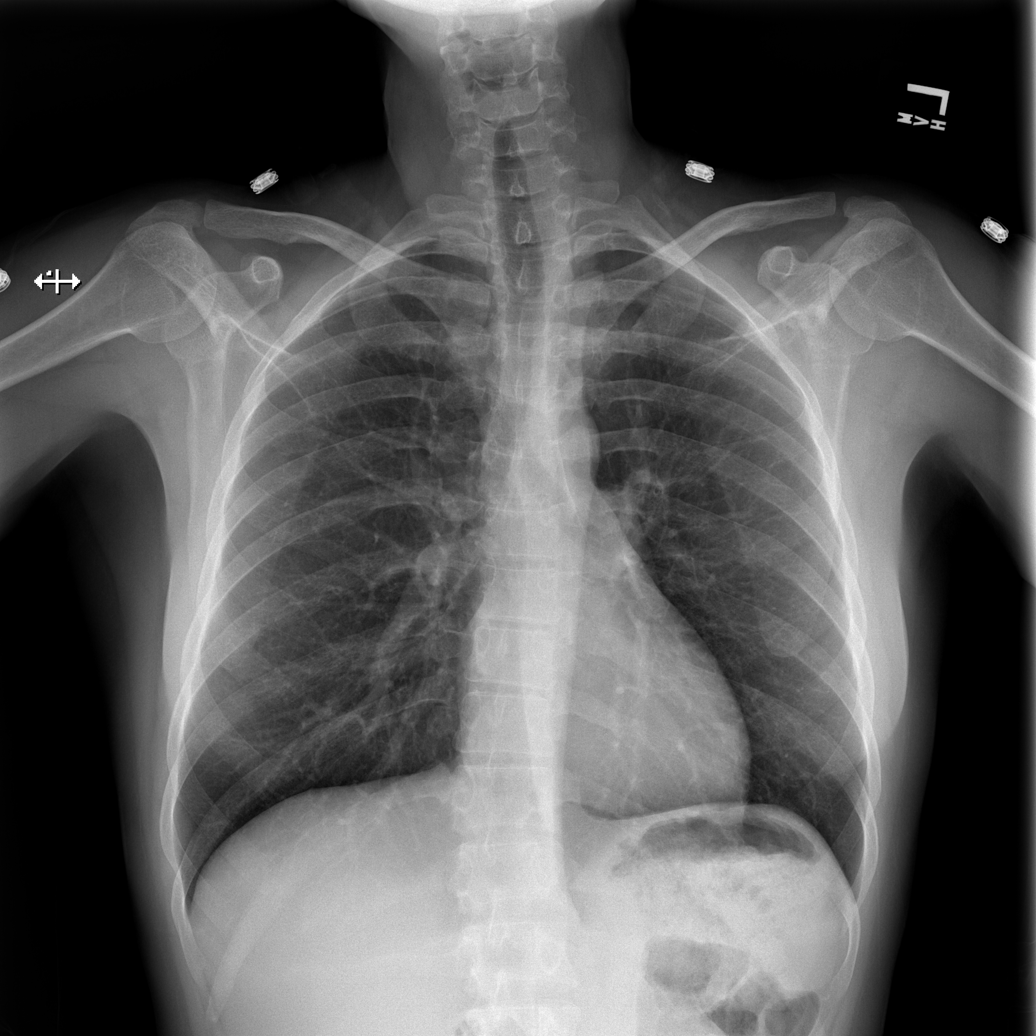

[w ribs ap upper right]
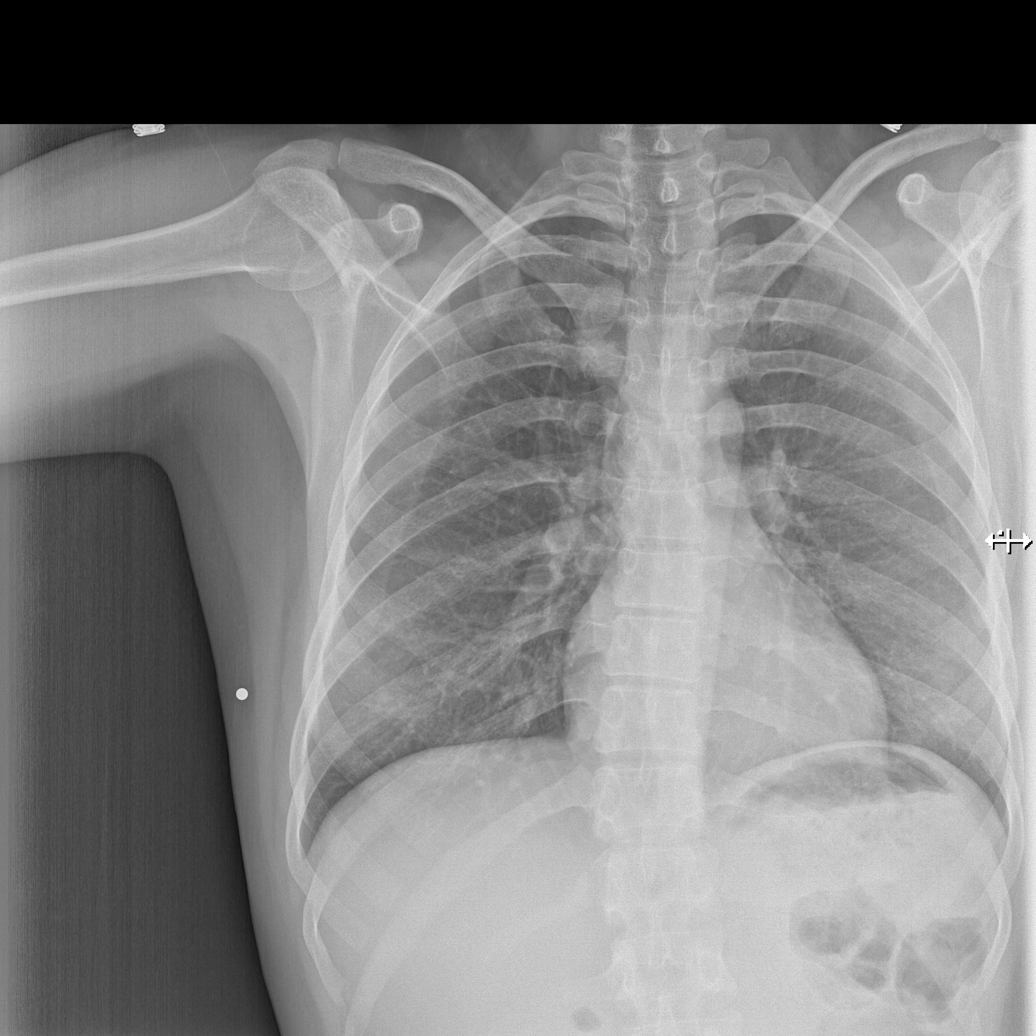

[w ribs ap lower right]
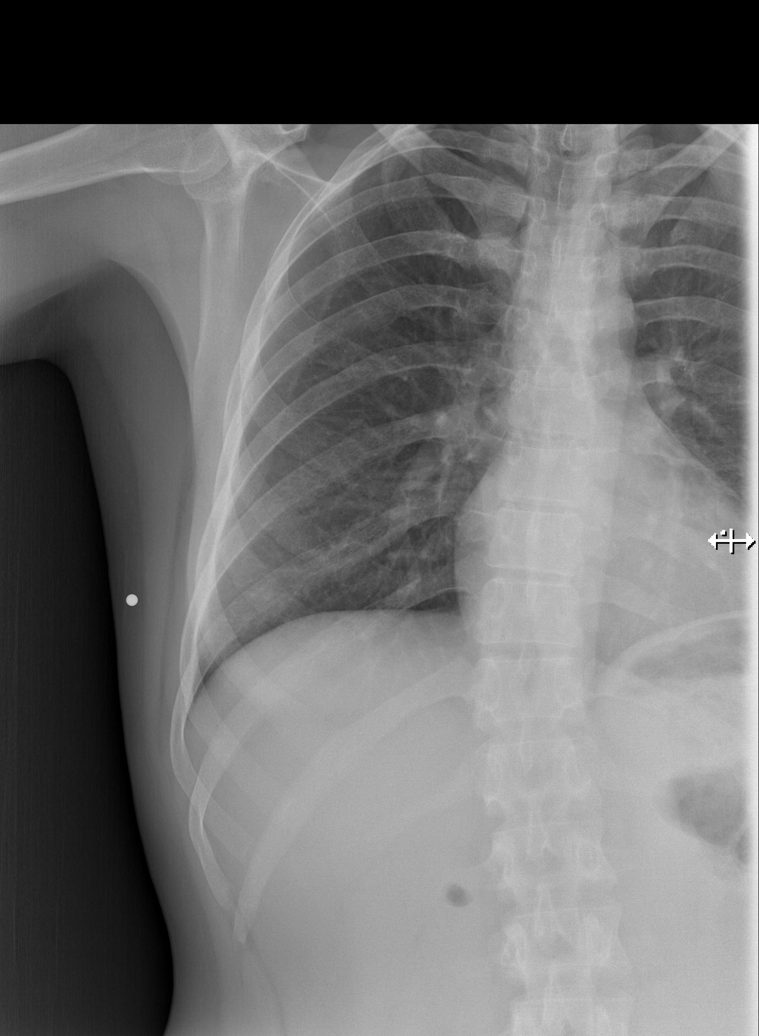

[w ribs obl right]
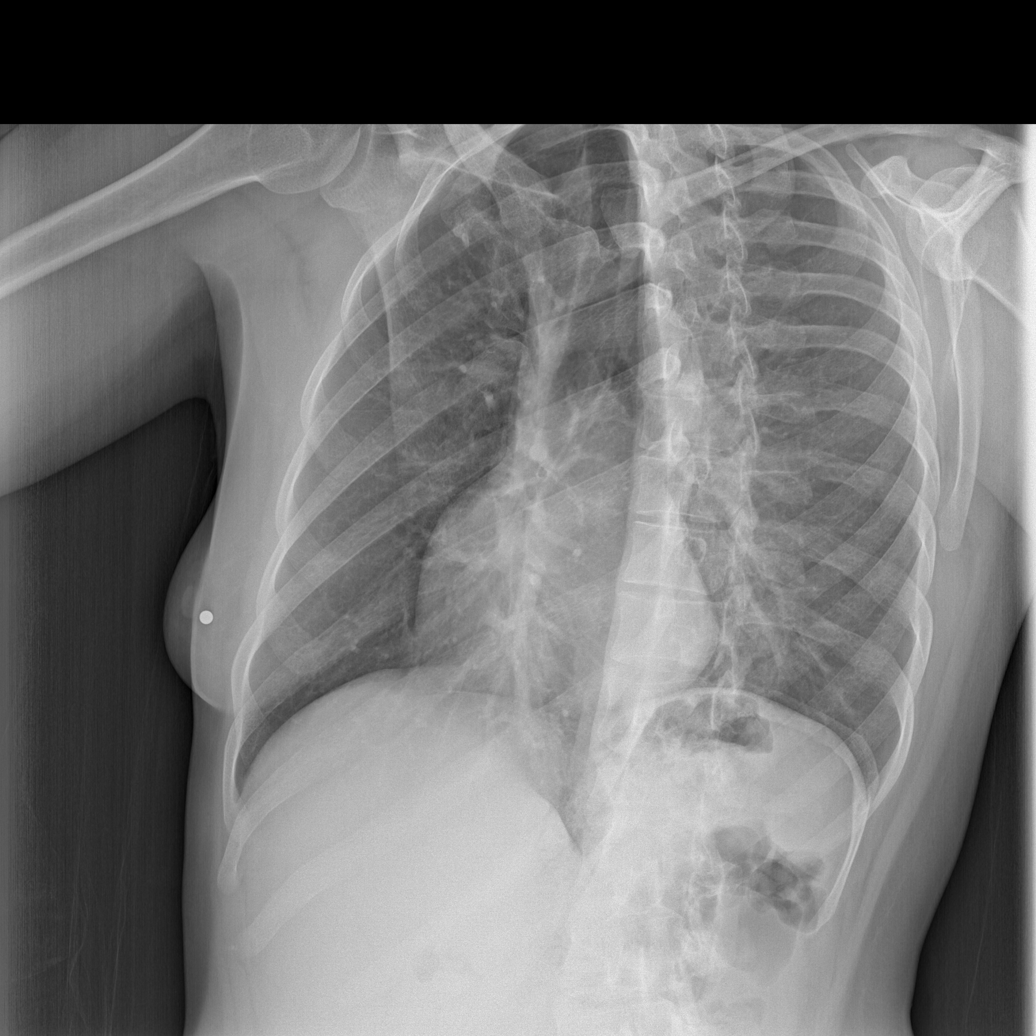

[4 of 4 positions shown; findings below may reference images not displayed]

FINDINGS: Normal heart size and pulmonary vascularity. No focal airspace
disease or consolidation in the lungs. No blunting of costophrenic
angles. No pneumothorax. Mediastinal contours appear intact.

Right ribs appear intact. No acute displaced fractures or focal bone
lesions identified.
IMPRESSION: No evidence of active pulmonary disease.  Negative right ribs.
